# Patient Record
Sex: Female | Born: 1950 | Race: White | Hispanic: No | Marital: Married | Smoking: Never smoker
Health system: Southern US, Community
[De-identification: ages and names within clinical notes are randomized; demographics above are authoritative.]

## PROBLEM LIST (undated history)

## (undated) DIAGNOSIS — Z8719 Personal history of other diseases of the digestive system: Secondary | ICD-10-CM

## (undated) DIAGNOSIS — B0059 Other herpesviral disease of eye: Secondary | ICD-10-CM

## (undated) DIAGNOSIS — K219 Gastro-esophageal reflux disease without esophagitis: Secondary | ICD-10-CM

## (undated) DIAGNOSIS — K5792 Diverticulitis of intestine, part unspecified, without perforation or abscess without bleeding: Secondary | ICD-10-CM

## (undated) DIAGNOSIS — I10 Essential (primary) hypertension: Secondary | ICD-10-CM

## (undated) DIAGNOSIS — E669 Obesity, unspecified: Secondary | ICD-10-CM

## (undated) DIAGNOSIS — Z8619 Personal history of other infectious and parasitic diseases: Secondary | ICD-10-CM

## (undated) HISTORY — DX: Obesity, unspecified: E66.9

## (undated) HISTORY — DX: Personal history of other infectious and parasitic diseases: Z86.19

## (undated) HISTORY — DX: Other herpesviral disease of eye: B00.59

## (undated) HISTORY — DX: Essential (primary) hypertension: I10

## (undated) HISTORY — PX: ESOPHAGOGASTRODUODENOSCOPY: SHX1529

## (undated) HISTORY — PX: COLONOSCOPY: SHX174

## (undated) HISTORY — DX: Gastro-esophageal reflux disease without esophagitis: K21.9

## (undated) HISTORY — PX: TUBAL LIGATION: SHX77

## (undated) HISTORY — DX: Diverticulitis of intestine, part unspecified, without perforation or abscess without bleeding: K57.92

## (undated) HISTORY — DX: Personal history of other diseases of the digestive system: Z87.19

---

## 1998-07-10 ENCOUNTER — Ambulatory Visit (HOSPITAL_COMMUNITY): Admission: RE | Admit: 1998-07-10 | Discharge: 1998-07-10 | Payer: Self-pay | Admitting: *Deleted

## 1998-07-10 ENCOUNTER — Encounter: Payer: Self-pay | Admitting: *Deleted

## 1999-03-21 ENCOUNTER — Other Ambulatory Visit: Admission: RE | Admit: 1999-03-21 | Discharge: 1999-03-21 | Payer: Self-pay | Admitting: *Deleted

## 1999-08-07 ENCOUNTER — Ambulatory Visit (HOSPITAL_COMMUNITY): Admission: RE | Admit: 1999-08-07 | Discharge: 1999-08-07 | Payer: Self-pay | Admitting: *Deleted

## 1999-08-07 ENCOUNTER — Encounter: Payer: Self-pay | Admitting: *Deleted

## 2000-04-07 ENCOUNTER — Other Ambulatory Visit: Admission: RE | Admit: 2000-04-07 | Discharge: 2000-04-07 | Payer: Self-pay | Admitting: *Deleted

## 2000-08-10 ENCOUNTER — Ambulatory Visit (HOSPITAL_COMMUNITY): Admission: RE | Admit: 2000-08-10 | Discharge: 2000-08-10 | Payer: Self-pay | Admitting: *Deleted

## 2000-08-10 ENCOUNTER — Encounter: Payer: Self-pay | Admitting: *Deleted

## 2001-04-08 ENCOUNTER — Other Ambulatory Visit: Admission: RE | Admit: 2001-04-08 | Discharge: 2001-04-08 | Payer: Self-pay | Admitting: *Deleted

## 2001-09-01 ENCOUNTER — Ambulatory Visit (HOSPITAL_COMMUNITY): Admission: RE | Admit: 2001-09-01 | Discharge: 2001-09-01 | Payer: Self-pay | Admitting: *Deleted

## 2001-09-01 ENCOUNTER — Encounter: Payer: Self-pay | Admitting: *Deleted

## 2002-07-10 ENCOUNTER — Other Ambulatory Visit: Admission: RE | Admit: 2002-07-10 | Discharge: 2002-07-10 | Payer: Self-pay | Admitting: *Deleted

## 2002-09-07 ENCOUNTER — Encounter: Payer: Self-pay | Admitting: *Deleted

## 2002-09-07 ENCOUNTER — Ambulatory Visit (HOSPITAL_COMMUNITY): Admission: RE | Admit: 2002-09-07 | Discharge: 2002-09-07 | Payer: Self-pay | Admitting: *Deleted

## 2003-06-09 HISTORY — PX: CHOLECYSTECTOMY: SHX55

## 2003-06-09 LAB — HM COLONOSCOPY

## 2003-09-19 ENCOUNTER — Ambulatory Visit (HOSPITAL_COMMUNITY): Admission: RE | Admit: 2003-09-19 | Discharge: 2003-09-19 | Payer: Self-pay | Admitting: *Deleted

## 2003-11-01 ENCOUNTER — Other Ambulatory Visit: Admission: RE | Admit: 2003-11-01 | Discharge: 2003-11-01 | Payer: Self-pay | Admitting: *Deleted

## 2004-03-30 ENCOUNTER — Encounter (INDEPENDENT_AMBULATORY_CARE_PROVIDER_SITE_OTHER): Payer: Self-pay | Admitting: *Deleted

## 2004-03-30 ENCOUNTER — Observation Stay (HOSPITAL_COMMUNITY): Admission: EM | Admit: 2004-03-30 | Discharge: 2004-03-31 | Payer: Self-pay | Admitting: *Deleted

## 2005-05-21 ENCOUNTER — Other Ambulatory Visit: Admission: RE | Admit: 2005-05-21 | Discharge: 2005-05-21 | Payer: Self-pay | Admitting: Obstetrics and Gynecology

## 2005-11-22 IMAGING — CR DG CHEST 1V PORT
1 series · 1 of 1 positions shown · non-contrast
Comparison: None.

CLINICAL DATA: Cholelithiasis. Preoperative respiratory evaluation prior to cholecystectomy.

PORTABLE CHEST - 1 VIEW  [DATE]/8991 1781 hours:

[view not recorded]
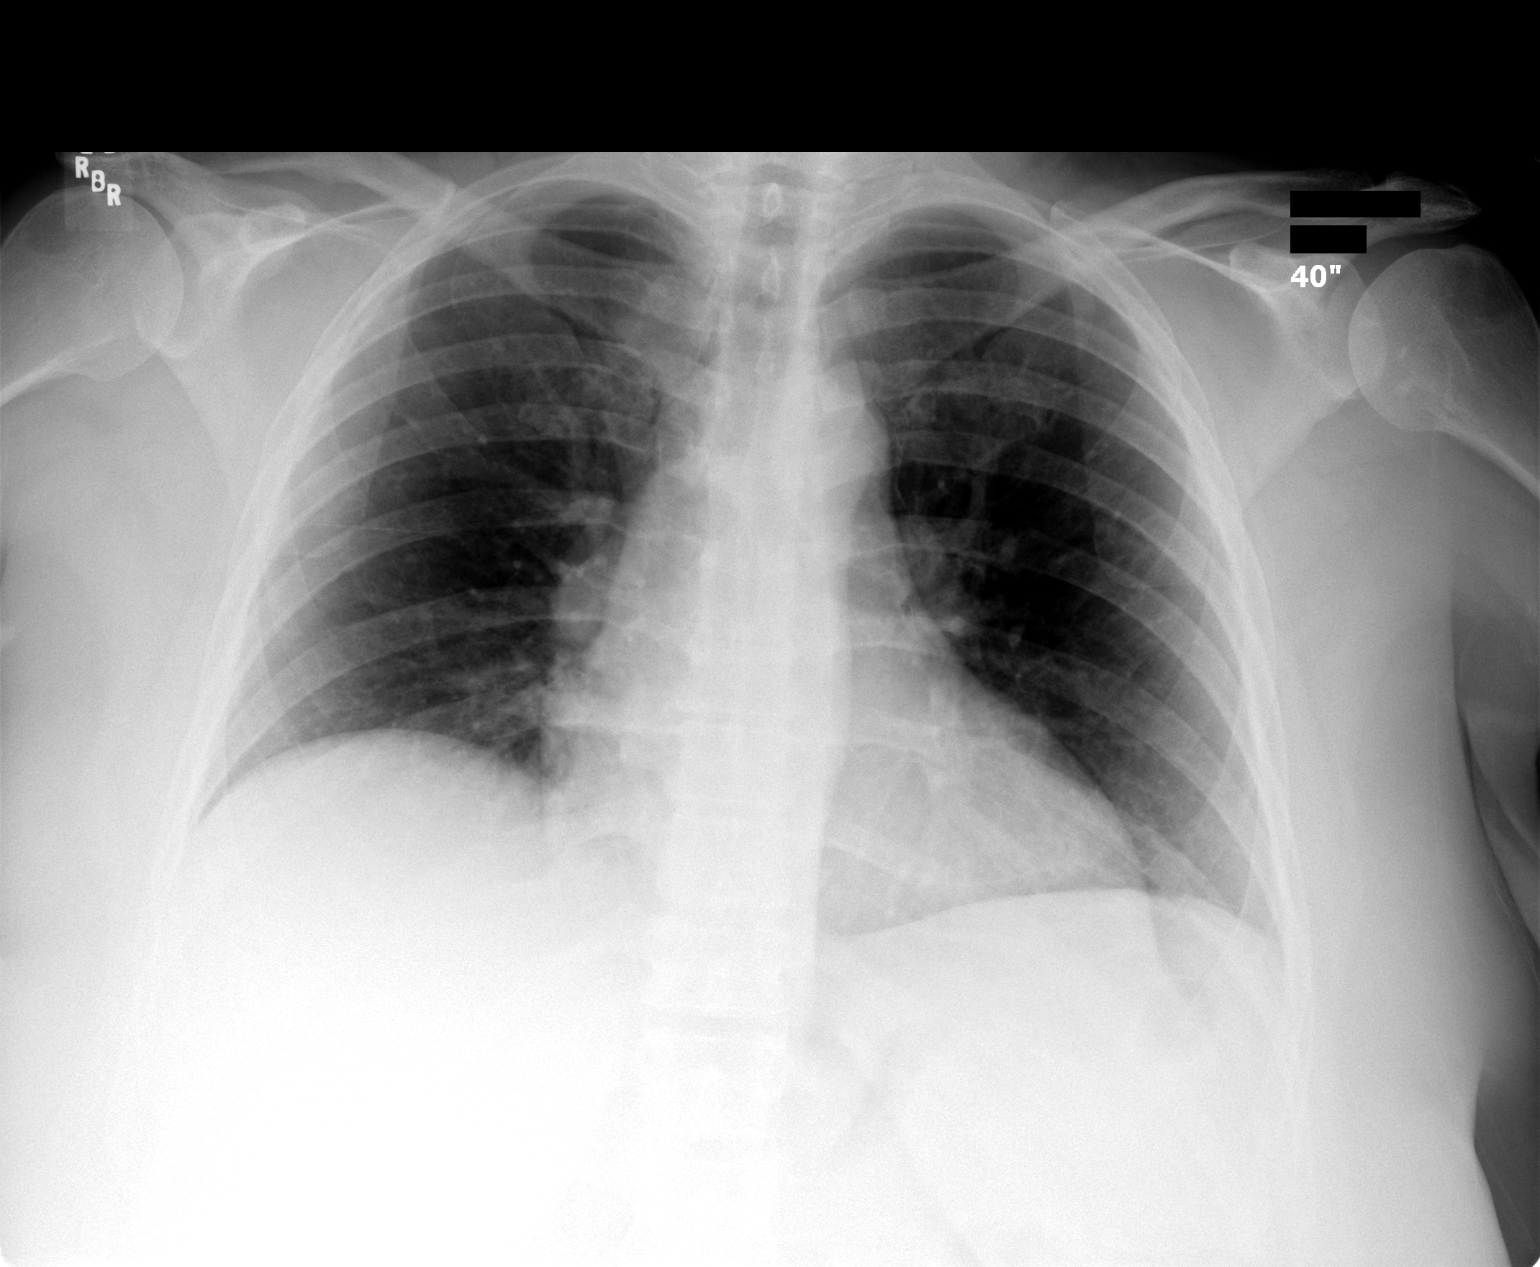

[1 of 1 positions shown; findings below may reference images not displayed]

FINDINGS: The cardiomediastinal silhouette is unremarkable for the AP technique. The lungs appear
clear. Mild elevation of the right hemidiaphragm is present without obvious intrathoracic cause.  

IMPRESSION

No evidence of acute disease.

## 2005-11-22 IMAGING — US US ABDOMEN COMPLETE
1 series · 14 of 25 positions shown · non-contrast
Comparison: None

CLINICAL DATA: Right upper quadrant abdominal pain.

COMPLETE ABDOMINAL ULTRASOUND  03/30/2004:

[Series 1: abdomen · 0.33mm/px · 14 of 70 slices shown]
[im 1/70]
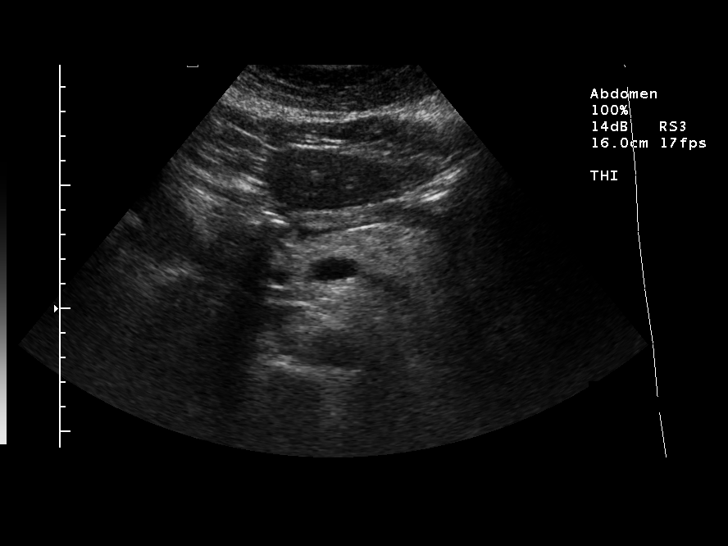
[im 6/70]
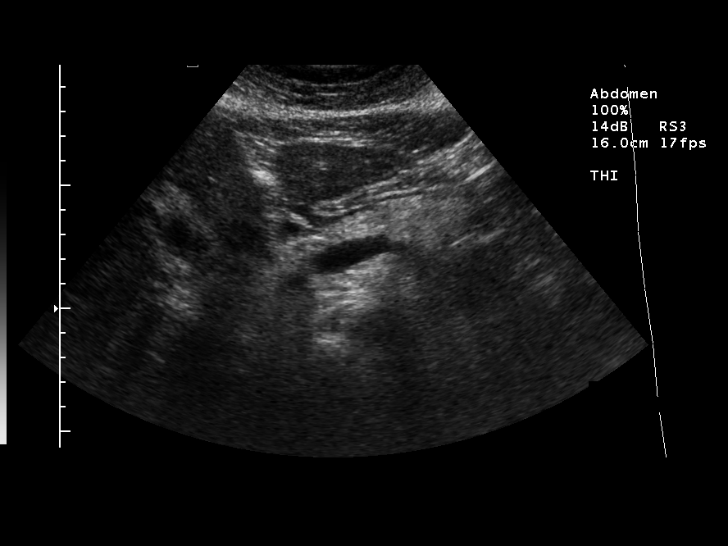
[im 12/70]
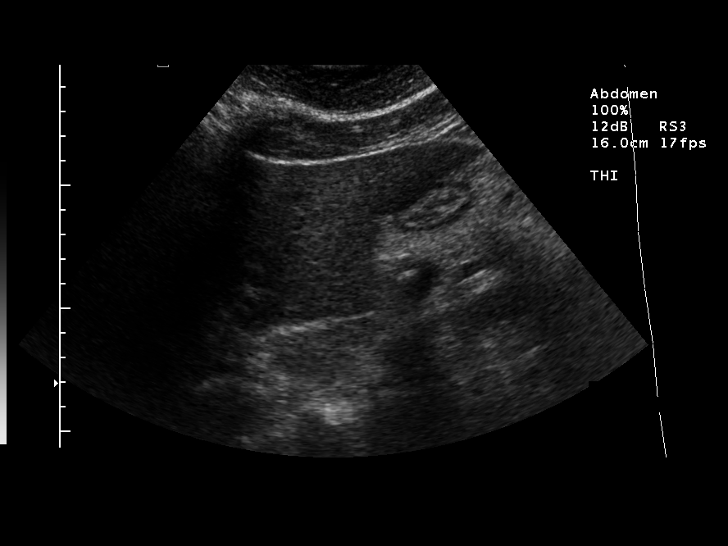
[im 18/70]
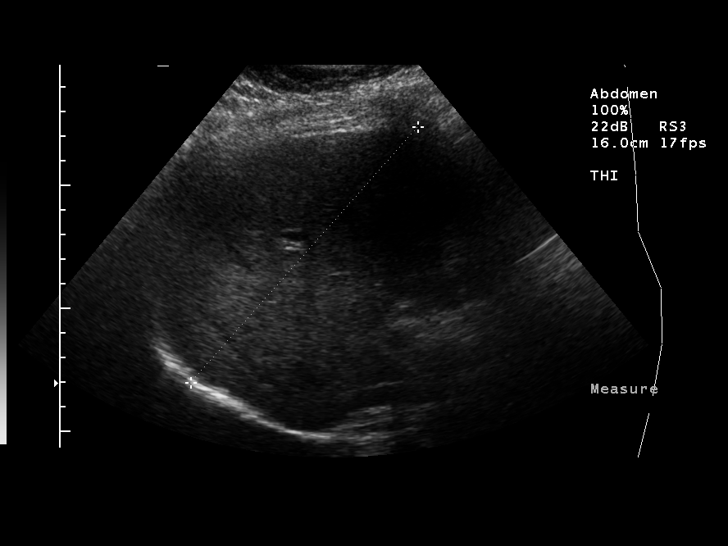
[im 24/70]
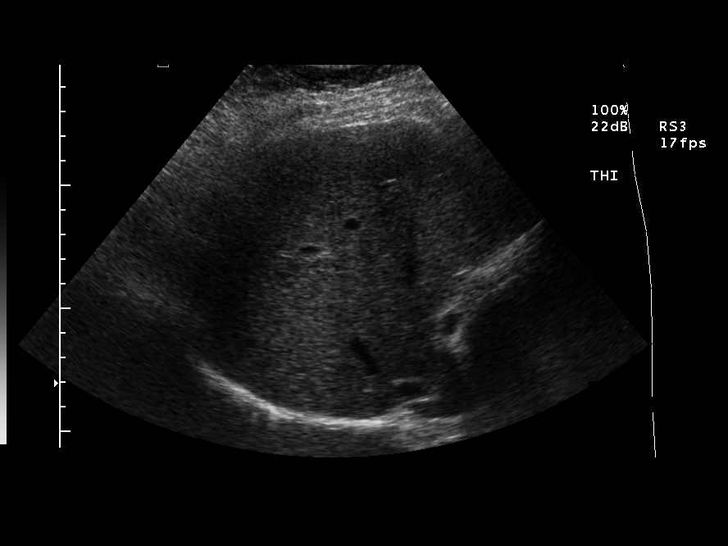
[im 26/70]
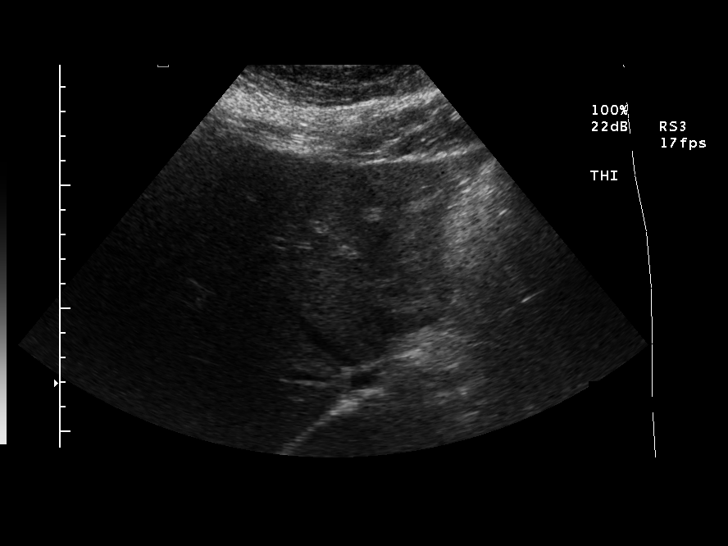
[im 32/70]
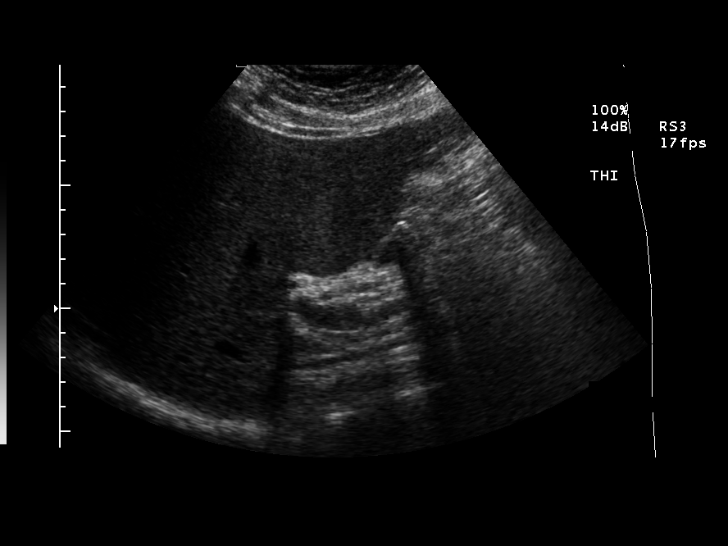
[im 38/70]
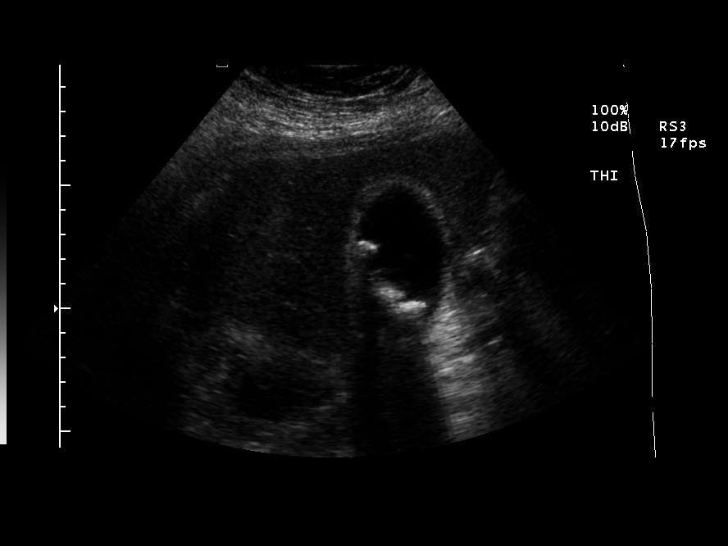
[im 44/70]
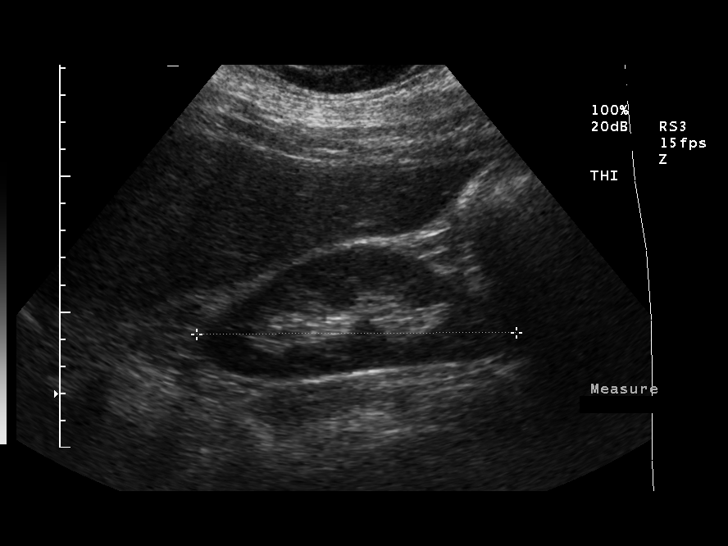
[im 47/70]
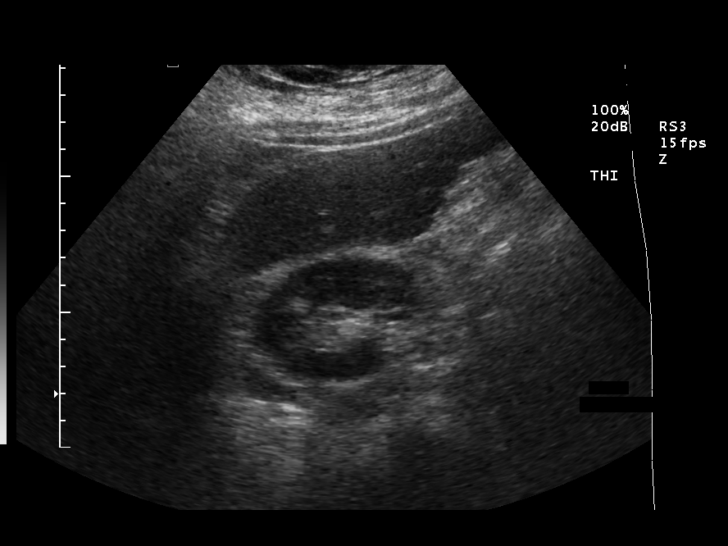
[im 52/70]
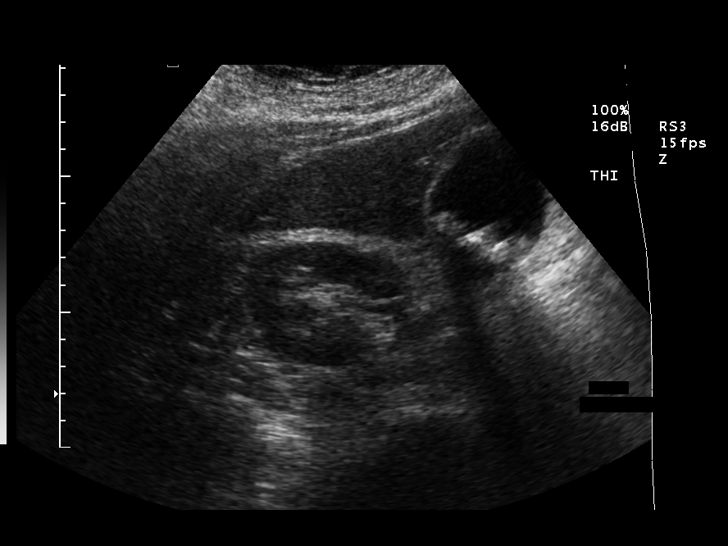
[im 58/70]
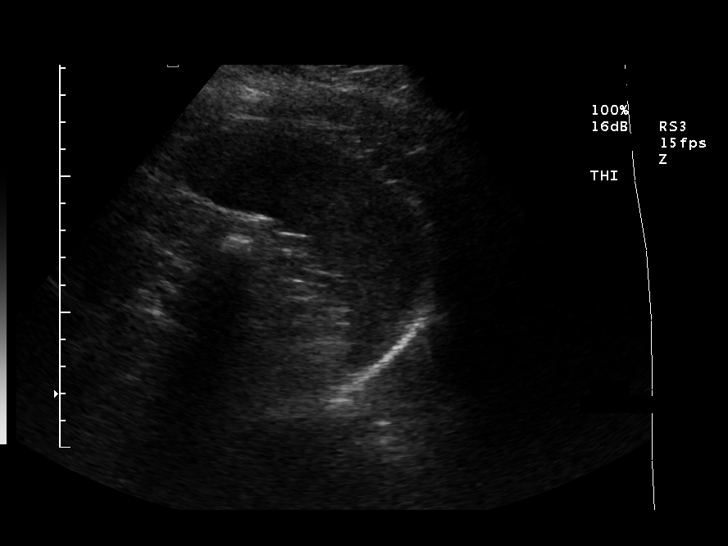
[im 64/70]
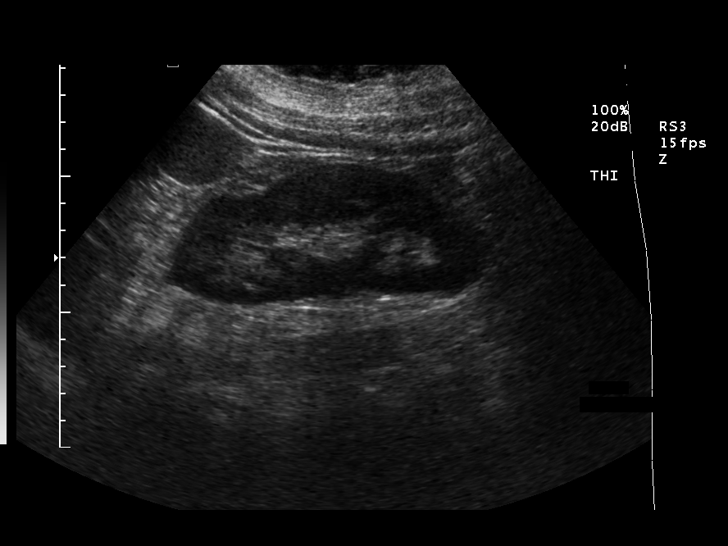
[im 70/70]
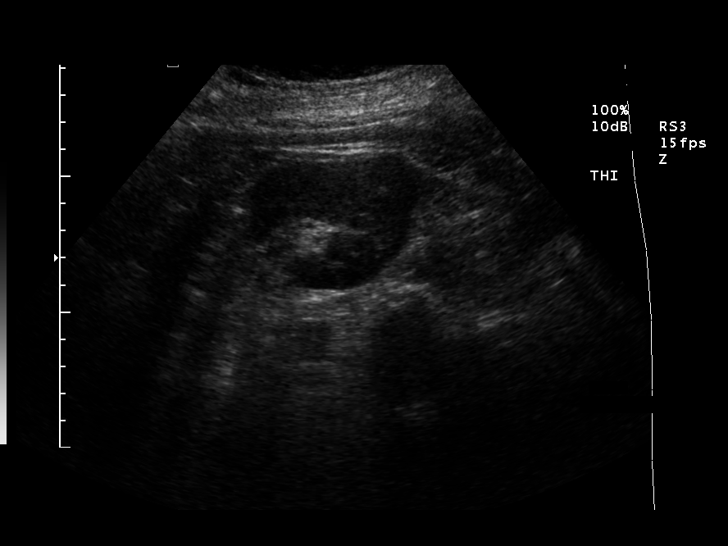

[14 of 25 positions shown; findings below may reference images not displayed]

FINDINGS: Gallbladder:  Multiple gallstones are present. There is no gallbladder wall thickening or
pericholecystic fluid. However, the technologist states that there is a positive Murphy's sign upon
examination of the gallbladder.

Common bile duct: Upper normal, 6 mm. No definite duct stones identified.

Liver:  Normal.

Inferior vena cava:  Patent.

Pancreas:  Normal.

Spleen:  Normal, 10.3 cm.

Right kidney:  Normal, 11.8 cm.

Left kidney:  Normal, 13.2 cm.

Abdominal aorta:  Normal, maximum diameter 1.5 cm.

IMPRESSION

1. Cholelithiasis and positive sonographic Murphy's sign.

2. Upper normal common bile duct measuring 6 mm in diameter. No duct stones identified.

3. Normal abdominal ultrasound otherwise.

## 2006-02-09 ENCOUNTER — Ambulatory Visit (HOSPITAL_COMMUNITY): Admission: RE | Admit: 2006-02-09 | Discharge: 2006-02-09 | Payer: Self-pay | Admitting: Obstetrics & Gynecology

## 2006-07-21 ENCOUNTER — Other Ambulatory Visit: Admission: RE | Admit: 2006-07-21 | Discharge: 2006-07-21 | Payer: Self-pay | Admitting: Obstetrics & Gynecology

## 2007-06-30 ENCOUNTER — Ambulatory Visit (HOSPITAL_COMMUNITY): Admission: RE | Admit: 2007-06-30 | Discharge: 2007-06-30 | Payer: Self-pay | Admitting: Obstetrics & Gynecology

## 2007-07-27 ENCOUNTER — Other Ambulatory Visit: Admission: RE | Admit: 2007-07-27 | Discharge: 2007-07-27 | Payer: Self-pay | Admitting: Obstetrics & Gynecology

## 2008-07-06 ENCOUNTER — Ambulatory Visit (HOSPITAL_COMMUNITY): Admission: RE | Admit: 2008-07-06 | Discharge: 2008-07-06 | Payer: Self-pay | Admitting: Obstetrics & Gynecology

## 2008-08-01 ENCOUNTER — Other Ambulatory Visit: Admission: RE | Admit: 2008-08-01 | Discharge: 2008-08-01 | Payer: Self-pay | Admitting: Obstetrics & Gynecology

## 2009-08-01 ENCOUNTER — Ambulatory Visit (HOSPITAL_COMMUNITY): Admission: RE | Admit: 2009-08-01 | Discharge: 2009-08-01 | Payer: Self-pay | Admitting: Obstetrics & Gynecology

## 2010-08-12 ENCOUNTER — Other Ambulatory Visit: Payer: Self-pay | Admitting: Obstetrics & Gynecology

## 2010-08-12 DIAGNOSIS — Z1231 Encounter for screening mammogram for malignant neoplasm of breast: Secondary | ICD-10-CM

## 2010-08-20 ENCOUNTER — Ambulatory Visit (HOSPITAL_COMMUNITY)
Admission: RE | Admit: 2010-08-20 | Discharge: 2010-08-20 | Disposition: A | Discharge status: Home / Self Care | Payer: BC Managed Care – PPO | Source: Ambulatory Visit | Attending: Obstetrics & Gynecology | Admitting: Obstetrics & Gynecology

## 2010-08-20 DIAGNOSIS — Z1231 Encounter for screening mammogram for malignant neoplasm of breast: Secondary | ICD-10-CM

## 2010-10-24 NOTE — Op Note (Signed)
NAMEMIHO, MONDA                 ACCOUNT NO.:  [id number]   MEDICAL RECORD NO.:  [id number]          PATIENT TYPE:  INP   LOCATION:  5741                         FACILITY:  [facility]   PHYSICIAN:  Gabrielle Dare. Janee Morn, M.D.DATE OF BIRTH:  [birth year]   DATE OF PROCEDURE:  03/30/2004  DATE OF DISCHARGE:                                 OPERATIVE REPORT   PREOPERATIVE DIAGNOSIS:  Acute cholecystitis.   POSTOPERATIVE DIAGNOSIS:  Acute cholecystitis.   PROCEDURE:  Laparoscopic cholecystectomy and intraoperative cholangiogram.   SURGEON:  Gabrielle Dare. Janee Morn, M.D.   ASSISTANT:  Anselm Pancoast. Zachery Dakins, M.D.   ANESTHESIA:  General.   HISTORY OF PRESENT ILLNESS:  The patient is a 60 year old white female with  a history of episodic right upper quadrant abdominal pain after eating, who  developed severe right upper quadrant pain last night.  It persisted.  She  came to the [facility] Emergency Department.  Workup was consistent with  acute cholecystitis, and she was brought to the operating room for a  cholecystectomy.   PROCEDURE IN DETAIL:  The patient received intravenous antibiotics.  She was  brought to the operating room after informed consent was obtained.  General  anesthesia was administered.  Her abdomen was prepped and draped in a  sterile fashion, and an infraumbilical incision was made.  Subcutaneous  tissues were dissected down, revealing the anterior fascia, which was  divided sharply.  The peritoneal cavity was entered under direct vision.  A  0 Vicryl pursestring suture was placed around the fascial opening.  The  Hasson trocar was inserted into the abdomen, and the abdomen was insufflated  with carbon dioxide in standard fashion.  Under direct vision, an 11 mm  epigastric and two 5 mm lateral ports were placed.  The gallbladder was very  distended.  It was drained initially with a __________  drainage system.  Once this was accomplished, the dome was retracted superomedially,  and the  infundibulum was retracted inferolaterally.  Dissection began laterally and  progressed medially.  This first identified an anterior artery.  This was  clipped twice proximally and once distally and divided.  Further dissection  easily revealed the cystic duct.  There was a good deal of inflammation, but  a good window was made between the cystic duct, the infundibulum, and the  liver.  Once adequate visualization was achieved, a clip was placed on the  infundibular-cystic duct junction.  A small nick was made in the cystic  duct, and a Reddick cholangiogram catheter was inserted.  Intraoperative  cholangiogram was obtained, which demonstrated no common bile duct filling  defects.  There was a generous-sized common bile duct and a small stump of  the cystic duct.  Once this was accomplished, the Reddick cholangiogram  catheter was removed.  Three clips were placed proximally on the cystic duct  and one distally and was divided.  Further dissection revealed another  branch of the cystic artery, which was clipped twice proximally and once  distally and divided.  The gallbladder was then taken off the liver bed.  We  did encounter another posterior branch of the cystic artery, and this was  clipped twice proximally.  The gallbladder was removed from the liver bed  with Bovie cautery.  Several areas were cauterized to get excellent  hemostasis.  The gallbladder was placed in an EndoCatch bag and taken out of  the abdomen via the umbilical cord site.  There were several gallstones.  At  this time, the liver bed was copiously irrigated, and cautery was used to  get excellent hemostasis.  After this was accomplished, the irrigant  returned clear.  Clips remained in good position.  The irrigant fluid was  evacuated.  The ports were removed under direct vision after rechecking the  gallbladder bed, and it was hemostatic.  The pneumoperitoneum was released,  and the Hasson trocar was  removed.  The umbilical fascia was closed by tying  the pursestring 0 Vicryl suture.  All four wounds were copiously irrigated.  Sponge, needle, and instrument counts were correct.  The skin of each wound  was closed with subcuticular 4-0 Vicryl sutures.  Benzoin, Steri-Strips, and  sterile dressings were applied.  The patient tolerated the procedure well,  without apparent complications, and was taken to the recovery room in stable  condition.       BET/MEDQ  D:  03/30/2004  T:  03/30/2004  Job:  [id number]

## 2010-10-24 NOTE — H&P (Signed)
[name], [name]                 ACCOUNT NO.:  [id number]   MEDICAL RECORD NO.:  [id number]          PATIENT TYPE:  INP   LOCATION:  1826                         FACILITY:  [facility]   PHYSICIAN:  Gabrielle Dare. Janee Morn, M.D.DATE OF BIRTH:  1950/12/29   DATE OF ADMISSION:  03/30/2004  DATE OF DISCHARGE:                                HISTORY & PHYSICAL   CHIEF COMPLAINT:  Right upper quadrant abdominal pain.   HISTORY OF PRESENT ILLNESS:  The patient is a 60 year old white female with  a history of episodic right upper quadrant abdominal pain episodes which  happened after eating.  She had another episode last night. The pain was  more intense this time and persisted through until this morning and she came  to the emergency room at [facility] for evaluation.  Workup here showed white blood cell count of 16.2, LFTs within normal  limits.  Abdominal ultrasound was then done which showed cholelithiasis and  a sonographic Murphy's sign.  The patient continues to have some moderate  right upper quadrant tenderness.  She denies nausea and vomiting.   PAST MEDICAL HISTORY:  Hypertension.   PAST SURGICAL HISTORY:  Cesarean section x 2.   CURRENT MEDICATIONS:  Lotensin 10 mg p.o. q.d.   SOCIAL HISTORY:  She does not smoke.  She drinks alcohol about one drink per  month.   REVIEW OF SYMPTOMS:  GENERAL:  Negative.  CARDIAC:  Negative.  PULMONARY:  Negative.  GI:  See the history of present illness.  GU:  Negative.  MUSCULOSKELETAL:  Negative.   PHYSICAL EXAMINATION:  VITAL SIGNS:  Temperature is 99.3, blood pressure  125/69, heart rate 72, respirations 20.  She is awake, alert, and in no  acute distress.  HEENT:  Pupils are equal and reactive.  Sclerae is nonicteric.  NECK:  Supple with no masses.  LUNGS:  Clear to auscultation bilaterally with normal respiratory excursion.  HEART:  Regular rate and rhythm.  PMI is palpable on the left chest.  ABDOMEN:  Soft.  Mildly obese.   No masses are palpated.  She does have  tenderness on the right upper quadrant but no guarding.  No organomegaly is  noted.  SKIN:  Warm and dry with no rashes.  EXTREMITIES:  No significant edema.   LABORATORY DATA:  White blood cell count 16.2, hemoglobin 12.4, platelets  390,000.  Basic metabolic panel is unremarkable.  Liver function tests are  normal.  Lipase is 22.  Urinalysis is negative.   Abdominal ultrasound results are as above.   IMPRESSION:  Acute cholecystitis.   PLAN:  Check an EKG and chest x-ray.  Give her some IV antibiotics.  Take  her to the operating room for a laparoscopic cholecystectomy with  intraoperative cholangiogram.  The procedure, risks, and benefits including  conversion to open procedure were discussed in detail with the patient and  she was agreeable.  Other questions were answered.       BET/MEDQ  D:  03/30/2004  T:  03/30/2004  Job:  [id number]

## 2010-10-24 NOTE — Discharge Summary (Signed)
[name], [name]                 ACCOUNT NO.:  [id number]   MEDICAL RECORD NO.:  [id number]          PATIENT TYPE:  INP   LOCATION:  5741                         FACILITY:  [facility]   PHYSICIAN:  Gabrielle Dare. Janee Morn, M.D.DATE OF BIRTH:  [birth year]   DATE OF ADMISSION:  03/30/2004  DATE OF DISCHARGE:  03/31/2004                                 DISCHARGE SUMMARY   DISCHARGE DIAGNOSES:  1.  Acute cholecystitis.  2.  Status post laparoscopic cholecystectomy with intraoperative      cholangiogram.   HISTORY OF PRESENT ILLNESS:  Patient is a 60 year old white female who has a  history of episodic right upper quadrant abdominal pain who after eating the  night before last developed very severe right upper quadrant pain that  persisted.  She came to the [facility] Emergency Department where work-up  was consistent with acute cholecystitis including white blood cell count  16.2 and ultrasound findings.  She was admitted and taken to the operating  room for cholecystectomy.   HOSPITAL COURSE:  Patient underwent an uncomplicated laparoscopic  cholecystectomy with intraoperative cholangiogram.  Postoperatively she  remained afebrile and hemodynamically stable, tolerating gradual advancement  of her diet and is discharged home on postoperative day one in stable  condition.   DIET:  Low fat.   ACTIVITY:  No lifting.   DISCHARGE MEDICATIONS:  She is to continue her previous medications and take  Percocet 5/325 one to two p.o. q.6h. p.r.n. pain.   FOLLOWUP:  With myself in three weeks.       BET/MEDQ  D:  03/31/2004  T:  03/31/2004  Job:  [id number]

## 2012-01-25 ENCOUNTER — Other Ambulatory Visit: Payer: Self-pay | Admitting: Obstetrics & Gynecology

## 2012-01-25 DIAGNOSIS — Z1231 Encounter for screening mammogram for malignant neoplasm of breast: Secondary | ICD-10-CM

## 2012-02-25 ENCOUNTER — Ambulatory Visit (HOSPITAL_COMMUNITY)
Admission: RE | Admit: 2012-02-25 | Discharge: 2012-02-25 | Disposition: A | Discharge status: Home / Self Care | Payer: BC Managed Care – PPO | Source: Ambulatory Visit | Attending: Obstetrics & Gynecology | Admitting: Obstetrics & Gynecology

## 2012-02-25 DIAGNOSIS — Z1231 Encounter for screening mammogram for malignant neoplasm of breast: Secondary | ICD-10-CM | POA: Insufficient documentation

## 2012-05-08 LAB — HM PAP SMEAR: HM Pap smear: NORMAL

## 2012-07-09 LAB — HM MAMMOGRAPHY

## 2013-01-06 ENCOUNTER — Encounter: Payer: Self-pay | Admitting: Internal Medicine

## 2013-01-06 ENCOUNTER — Ambulatory Visit (INDEPENDENT_AMBULATORY_CARE_PROVIDER_SITE_OTHER): Payer: BC Managed Care – PPO | Admitting: Internal Medicine

## 2013-01-06 VITALS — BP 124/84 | HR 74 | Temp 98.4°F | Wt 224.0 lb

## 2013-01-06 DIAGNOSIS — Z8489 Family history of other specified conditions: Secondary | ICD-10-CM

## 2013-01-06 DIAGNOSIS — Z9189 Other specified personal risk factors, not elsewhere classified: Secondary | ICD-10-CM

## 2013-01-06 DIAGNOSIS — Z820 Family history of epilepsy and other diseases of the nervous system: Secondary | ICD-10-CM | POA: Insufficient documentation

## 2013-01-06 DIAGNOSIS — K219 Gastro-esophageal reflux disease without esophagitis: Secondary | ICD-10-CM

## 2013-01-06 DIAGNOSIS — I10 Essential (primary) hypertension: Secondary | ICD-10-CM

## 2013-01-06 DIAGNOSIS — Z87898 Personal history of other specified conditions: Secondary | ICD-10-CM | POA: Insufficient documentation

## 2013-01-06 NOTE — Patient Instructions (Signed)
Continue lifestyle intervention healthy eating and exercise . For now no asa until evaluated by Dr Loreta Ave and can discuss at your check up in March.  Losing weight even a little bit can help all your risk factors and gerd sx.  Get enough sleep  .     Calendar    2 weeks    Of sleep  Eating times  Beverages and activity .   To implement change .  Preventive   Care  In  3  /15

## 2013-01-06 NOTE — Progress Notes (Signed)
Chief Complaint  Patient presents with  . Establish Care  . Hypertension    HPI: Patient comes in as new patient visit . She is originally from the [place] area. Previous care was Dr. Maurice Small   OB/GYN is Dr. Leda Quail. Patient's sister is in our practice and has a history of significant sleep apnea secondary atrial fib and stroke. She's had hypertension for about 20 years well controlled on Lotensin. Has some questions about risk and intervention.  Last laboratory tests were in March of this year presents them here in the her normal. Cardiovascular pulmonary symptoms. She has some snoring at night but no obstructive symptoms. No palpitations racing heart syncope.  GERD : Gets reflux symptoms about once a week.  Taking Tums.   Doesn't like to take meds no dyshphagi but Dr. Loreta Ave wants to do an endoscopy to rule out significant problems. She was put on DEXA lawns at some point but stopped it.  Does lifestyle interventions. Limits  alcohol and caffeine.  Husband works part Medical illustrator of home. Medical problems  but is independent HH of 2  Pet dog.  ROS: See pertinent positives and negatives per HPI. Some notation of a vitamin D deficiency at some time and history of an elevated blood sugar that has resolved. Otherwise 12 system review is negative except as per history of present illness.  Past Medical History  Diagnosis Date  . Chicken pox   . GERD (gastroesophageal reflux disease)   . Diverticulitis   . Hypertension     Family History  Problem Relation Age of Onset  . Hyperlipidemia Mother   . Hypertension Mother   . Hypertension Father   . Hypertension Sister   . Stroke Sister   . Arthritis Sister   . COPD Mother     Deceased  . Cancer - Other Father     Liver deceased  . Obstructive Sleep Apnea Sister     And secondary atrial fibrillation.    History   Social History  . Marital Status: Married    Spouse Name: N/A    Number of Children: N/A  . Years of  Education: N/A   Social History Main Topics  . Smoking status: Never Smoker   . Smokeless tobacco: None  . Alcohol Use: No  . Drug Use: None  . Sexually Active: None   Other Topics Concern  . 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 benazepril (LOTENSIN) 20 MG tablet Take 20 mg by mouth daily.       No facility-administered encounter medications on file as of 01/06/2013.    EXAM:  BP 124/84  Pulse 74  Temp(Src) 98.4 F (36.9 C) (Oral)  Wt 224 lb (101.606 kg)  SpO2 98%  LMP 06/08/2004  There is no height on file to calculate BMI.  GENERAL: vitals reviewed and listed above, alert, oriented, appears well hydrated and in no acute distress HEENT: atraumatic, conjunctiva  clear, no obvious abnormalities on inspection of external nose and ears TMs clear OP : no lesion edema or exudate airway open NECK: no obvious masses on inspection palpation  no adenopathy or bruits LUNGS: clear to auscultation bilaterally, no wheezes, rales or rhonchi, good air movement CV: HRRR, no gallops or murmurs no clubbing cyanosis or  peripheral edema nl cap refill pulses intact. Abdomen soft without organomegaly guarding or rebound. MS: moves all extremities without noticeable focal  abnormality PSYCH: pleasant and cooperative, no obvious depression or anxiety Reviewed laboratory from last March from Dr. Valentina Lucks creatinine blood sugar and lipids are all normal as well as BMP. TSH is also normal Cholesterol 177 range reset name 0.7 potassium 4.6 blood sugar 93  ASSESSMENT AND PLAN:  Discussed the following assessment and plan:  Unspecified essential hypertension  GERD (gastroesophageal reflux  disease)  History of snoring  Family history of sleep apnea - Sister also had CVA and atrial fibrillation Controlled followup with Dr. Loreta Ave. Plan for preventive visit in the spring when she is due can do a baseline EKG at this time and revisit the aspirin prophylaxis question. Counseled today strategies for healthy eating and exercise with a busy lifestyle. History of snoring but doesn't sound like sleep apnea although there is a family history. -Patient advised to return or notify health care team  if symptoms worsen or persist or new concerns arise.  Patient Instructions  Continue lifestyle intervention healthy eating and exercise . For now no asa until evaluated by Dr Loreta Ave and can discuss at your check up in March.  Losing weight even a little bit can help all your risk factors and gerd sx.  Get enough sleep  .     Calendar    2 weeks    Of sleep  Eating times  Beverages and activity .   To implement change .  Preventive   Care  In  3  /15       Neta Mends. Panosh M.D.

## 2013-02-02 ENCOUNTER — Ambulatory Visit: Payer: Self-pay | Admitting: Obstetrics & Gynecology

## 2013-03-02 ENCOUNTER — Encounter: Payer: Self-pay | Admitting: Obstetrics & Gynecology

## 2013-03-03 ENCOUNTER — Ambulatory Visit (INDEPENDENT_AMBULATORY_CARE_PROVIDER_SITE_OTHER): Payer: BC Managed Care – PPO | Admitting: Obstetrics & Gynecology

## 2013-03-03 ENCOUNTER — Encounter: Payer: Self-pay | Admitting: Obstetrics & Gynecology

## 2013-03-03 VITALS — BP 148/78 | HR 68 | Resp 20 | Ht 63.0 in | Wt 225.8 lb

## 2013-03-03 DIAGNOSIS — Z01419 Encounter for gynecological examination (general) (routine) without abnormal findings: Secondary | ICD-10-CM

## 2013-03-03 DIAGNOSIS — Z124 Encounter for screening for malignant neoplasm of cervix: Secondary | ICD-10-CM

## 2013-03-03 MED ORDER — CLOBETASOL PROPIONATE 0.05 % EX OINT: TOPICAL_OINTMENT | CUTANEOUS | Status: DC | PRN

## 2013-03-03 NOTE — Patient Instructions (Signed)

## 2013-03-03 NOTE — Progress Notes (Signed)
62 y.o. [id number] MarriedCaucasianF here for annual exam.  Doing well.  No vaginal bleeding.  Seeing Dr. Fabian Sharp as new PCP.  Having endoscopy with Dr. Loreta Ave due to GERD.  Work is going well.  Thinking about retirement.     Patient's last menstrual period was 06/08/2004.          Sexually active: no  The current method of family planning is tubal ligation.    Exercising: yes  walking Smoker:  no  Health Maintenance: Pap:  12/03/10 ASCUS/negative HR HPV History of abnormal Pap:  no MMG:  02/25/12 normal Colonoscopy:  2006 repeat in 10 years (Dr. Loreta Ave) BMD:   1/10 normal, plan next year TDaP:  1/09 Screening Labs: PCP, Hb today: PCP, Urine today: PCP   reports that she has never smoked. She has never used smokeless tobacco. She reports that  drinks alcohol. She reports that she does not use illicit drugs.  Past Medical History  Diagnosis Date  . Chicken pox   . GERD (gastroesophageal reflux disease)   . Diverticulitis   . Hypertension     Past Surgical History  Procedure Laterality Date  . Cesarean section      2  . Cholecystectomy  2005  . Tubal ligation      BTL    Current Outpatient Prescriptions  Medication Sig Dispense Refill  . benazepril (LOTENSIN) 20 MG tablet Take 20 mg by mouth daily.      . clobetasol ointment (TEMOVATE) 0.05 % Apply topically as needed.       No current facility-administered medications for this visit.    Family History  Problem Relation Age of Onset  . Hyperlipidemia Mother   . Hypertension Mother   . Hypertension Father   . Hypertension Sister   . Stroke Sister   . Arthritis Sister   . COPD Mother     Deceased  . Cancer - Other Father     Liver deceased  . Obstructive Sleep Apnea Sister     And secondary atrial fibrillation.    ROS:  Pertinent items are noted in HPI.  Otherwise, a comprehensive ROS was negative.  Exam:   BP 148/78  Pulse 68  Resp 20  Ht 5\' 3"  (1.6 m)  Wt 225 lb 12.8 oz (102.422 kg)  BMI 40.01 kg/m2  LMP  06/08/2004  Weight change: +5lbs  Height: 5\' 3"  (160 cm)  Ht Readings from Last 3 Encounters:  03/03/13 5\' 3"  (1.6 m)    General appearance: alert, cooperative and appears stated age Head: Normocephalic, without obvious abnormality, atraumatic Neck: no adenopathy, supple, symmetrical, trachea midline and thyroid normal to inspection and palpation Lungs: clear to auscultation bilaterally Breasts: normal appearance, no masses or tenderness Heart: regular rate and rhythm Abdomen: soft, non-tender; bowel sounds normal; no masses,  no organomegaly Extremities: extremities normal, atraumatic, no cyanosis or edema Skin: Skin color, texture, turgor normal. No rashes or lesions Lymph nodes: Cervical, supraclavicular, and axillary nodes normal. No abnormal inguinal nodes palpated Neurologic: Grossly normal   Pelvic: External genitalia:  no lesions              Urethra:  normal appearing urethra with no masses, tenderness or lesions              Bartholins and Skenes: normal                 Vagina: normal appearing vagina with normal color and discharge, no lesions  Cervix: no lesions              Pap taken: yes Bimanual Exam:  Uterus:  normal size, contour, position, consistency, mobility, non-tender              Adnexa: normal adnexa and no mass, fullness, tenderness               Rectovaginal: Confirms               Anus:  normal sphincter tone, no lesions  A:  Well Woman with normal exam PMP, no HRT H/O LS&A hypertension  P:   Mammogram yearly pap smear only today.  ASCUS with neg HR HPV 2012. Labs with Dr. Fabian Sharp Rx for clobetasol to pharmacy return annually or prn  An After Visit Summary was printed and given to the patient.

## 2013-03-07 LAB — IPS PAP SMEAR ONLY

## 2013-04-05 ENCOUNTER — Other Ambulatory Visit: Payer: Self-pay | Admitting: Obstetrics & Gynecology

## 2013-04-05 DIAGNOSIS — Z1231 Encounter for screening mammogram for malignant neoplasm of breast: Secondary | ICD-10-CM

## 2013-04-28 ENCOUNTER — Ambulatory Visit (HOSPITAL_COMMUNITY): Payer: BC Managed Care – PPO

## 2013-05-02 ENCOUNTER — Ambulatory Visit (HOSPITAL_COMMUNITY)
Admission: RE | Admit: 2013-05-02 | Discharge: 2013-05-02 | Disposition: A | Discharge status: Home / Self Care | Payer: BC Managed Care – PPO | Source: Ambulatory Visit | Attending: Obstetrics & Gynecology | Admitting: Obstetrics & Gynecology

## 2013-05-02 DIAGNOSIS — Z1231 Encounter for screening mammogram for malignant neoplasm of breast: Secondary | ICD-10-CM | POA: Insufficient documentation

## 2013-05-12 ENCOUNTER — Telehealth: Payer: Self-pay | Admitting: Internal Medicine

## 2013-05-12 ENCOUNTER — Other Ambulatory Visit: Payer: Self-pay | Admitting: Family Medicine

## 2013-05-12 MED ORDER — BENAZEPRIL HCL 20 MG PO TABS: 20.0000 mg | ORAL_TABLET | Freq: Every day | ORAL | Status: DC

## 2013-05-12 NOTE — Telephone Encounter (Signed)
Sent to the pharmacy by e-scribe. 

## 2013-05-12 NOTE — Telephone Encounter (Signed)
Pt request refillbenazepril (LOTENSIN) 20 MG tablet  [facility]

## 2013-08-22 ENCOUNTER — Other Ambulatory Visit (INDEPENDENT_AMBULATORY_CARE_PROVIDER_SITE_OTHER): Payer: BC Managed Care – PPO

## 2013-08-22 DIAGNOSIS — Z0000 Encounter for general adult medical examination without abnormal findings: Secondary | ICD-10-CM

## 2013-08-22 LAB — CBC WITH DIFFERENTIAL/PLATELET
BASOS ABS: 0 10*3/uL (ref 0.0–0.1)
Basophils Relative: 0.5 % (ref 0.0–3.0)
EOS ABS: 0.3 10*3/uL (ref 0.0–0.7)
EOS PCT: 4.4 % (ref 0.0–5.0)
HEMATOCRIT: 36.2 % (ref 36.0–46.0)
HEMOGLOBIN: 12 g/dL (ref 12.0–15.0)
LYMPHS PCT: 25.8 % (ref 12.0–46.0)
Lymphs Abs: 1.8 10*3/uL (ref 0.7–4.0)
MCHC: 33.2 g/dL (ref 30.0–36.0)
MCV: 86.8 fl (ref 78.0–100.0)
Monocytes Absolute: 0.4 10*3/uL (ref 0.1–1.0)
Monocytes Relative: 6.3 % (ref 3.0–12.0)
NEUTROS PCT: 63 % (ref 43.0–77.0)
Neutro Abs: 4.4 10*3/uL (ref 1.4–7.7)
Platelets: 283 10*3/uL (ref 150.0–400.0)
RBC: 4.17 Mil/uL (ref 3.87–5.11)
RDW: 13.1 % (ref 11.5–14.6)
WBC: 6.9 10*3/uL (ref 4.5–10.5)

## 2013-08-22 LAB — TSH: TSH: 2.46 u[IU]/mL (ref 0.35–5.50)

## 2013-08-22 LAB — HEPATIC FUNCTION PANEL
ALBUMIN: 3.8 g/dL (ref 3.5–5.2)
ALK PHOS: 73 U/L (ref 39–117)
ALT: 18 U/L (ref 0–35)
AST: 18 U/L (ref 0–37)
BILIRUBIN TOTAL: 0.5 mg/dL (ref 0.3–1.2)
Bilirubin, Direct: 0 mg/dL (ref 0.0–0.3)
Total Protein: 7 g/dL (ref 6.0–8.3)

## 2013-08-22 LAB — LIPID PANEL
CHOL/HDL RATIO: 3
CHOLESTEROL: 194 mg/dL (ref 0–200)
HDL: 61.4 mg/dL (ref >39.00)
LDL CALC: 122 mg/dL — AB (ref 0–99)
TRIGLYCERIDES: 52 mg/dL (ref 0.0–149.0)
VLDL: 10.4 mg/dL (ref 0.0–40.0)

## 2013-08-22 LAB — BASIC METABOLIC PANEL
BUN: 23 mg/dL (ref 6–23)
CALCIUM: 8.9 mg/dL (ref 8.4–10.5)
CHLORIDE: 107 meq/L (ref 96–112)
CO2: 30 mEq/L (ref 19–32)
CREATININE: 0.9 mg/dL (ref 0.4–1.2)
GFR: 72.08 mL/min (ref >60.00)
GLUCOSE: 115 mg/dL — AB (ref 70–99)
Potassium: 5.1 mEq/L (ref 3.5–5.1)
Sodium: 142 mEq/L (ref 135–145)

## 2013-08-29 ENCOUNTER — Ambulatory Visit (INDEPENDENT_AMBULATORY_CARE_PROVIDER_SITE_OTHER): Payer: BC Managed Care – PPO | Admitting: Internal Medicine

## 2013-08-29 ENCOUNTER — Encounter: Payer: Self-pay | Admitting: Internal Medicine

## 2013-08-29 VITALS — BP 144/80 | HR 66 | Temp 98.4°F | Ht 62.75 in | Wt 228.0 lb

## 2013-08-29 DIAGNOSIS — I10 Essential (primary) hypertension: Secondary | ICD-10-CM

## 2013-08-29 DIAGNOSIS — Z0000 Encounter for general adult medical examination without abnormal findings: Secondary | ICD-10-CM

## 2013-08-29 DIAGNOSIS — R7309 Other abnormal glucose: Secondary | ICD-10-CM

## 2013-08-29 DIAGNOSIS — R739 Hyperglycemia, unspecified: Secondary | ICD-10-CM | POA: Insufficient documentation

## 2013-08-29 DIAGNOSIS — K219 Gastro-esophageal reflux disease without esophagitis: Secondary | ICD-10-CM

## 2013-08-29 DIAGNOSIS — Z6837 Body mass index (BMI) 37.0-37.9, adult: Secondary | ICD-10-CM | POA: Insufficient documentation

## 2013-08-29 DIAGNOSIS — I8390 Asymptomatic varicose veins of unspecified lower extremity: Secondary | ICD-10-CM | POA: Insufficient documentation

## 2013-08-29 MED ORDER — BENAZEPRIL-HYDROCHLOROTHIAZIDE 20-12.5 MG PO TABS: 1.0000 | ORAL_TABLET | Freq: Every day | ORAL | Status: DC

## 2013-08-29 NOTE — Patient Instructions (Addendum)
lifestyle intervention healthy eating and exercise . 150 minutes of exercise weeks  ,  Lose weight  To healthy levels. Avoid trans fats and processed foods;  Increase fresh fruits and veges to 5 servings per day. And avoid  All sweet beverages  Including tea and juice. Your blood sugar is in the prediabetic range and you are at risk to develop diabetes .  Let us know how we can help  Avoiding sugars and simple carbs to prevent getting diabetes.  Considering seeing dietition. Get enough sleep . Walking is good exercise.   change the BP medication.  To add low dose diuretic  DASH diet is helpful also   DASH Diet The DASH diet stands for "Dietary Approaches to Stop Hypertension." It is a healthy eating plan that has been shown to reduce high blood pressure (hypertension) in as little as 14 days, while also possibly providing other significant health benefits. These other health benefits include reducing the risk of breast cancer after menopause and reducing the risk of type 2 diabetes, heart disease, colon cancer, and stroke. Health benefits also include weight loss and slowing kidney failure in patients with chronic kidney disease.  DIET GUIDELINES  Limit salt (sodium). Your diet should contain less than 1500 mg of sodium daily.  Limit refined or processed carbohydrates. Your diet should include mostly whole grains. Desserts and added sugars should be used sparingly.  Include small amounts of heart-healthy fats. These types of fats include nuts, oils, and tub margarine. Limit saturated and trans fats. These fats have been shown to be harmful in the body. CHOOSING FOODS  The following food groups are based on a 2000 calorie diet. See your Registered Dietitian for individual calorie needs. Grains and Grain Products (6 to 8 servings daily)  Eat More Often: Whole-wheat bread, brown rice, whole-grain or wheat pasta, quinoa, popcorn without added fat or salt (air popped).  Eat Less Often: White  bread, white pasta, white rice, cornbread. Vegetables (4 to 5 servings daily)  Eat More Often: Fresh, frozen, and canned vegetables. Vegetables may be raw, steamed, roasted, or grilled with a minimal amount of fat.  Eat Less Often/Avoid: Creamed or fried vegetables. Vegetables in a cheese sauce. Fruit (4 to 5 servings daily)  Eat More Often: All fresh, canned (in natural juice), or frozen fruits. Dried fruits without added sugar. One hundred percent fruit juice ( cup [237 mL] daily).  Eat Less Often: Dried fruits with added sugar. Canned fruit in light or heavy syrup. [employer], Fish, and Poultry (2 servings or less daily. One serving is 3 to 4 oz [85-114 g]).  Eat More Often: Ninety percent or leaner ground beef, tenderloin, sirloin. Round cuts of beef, chicken breast, [place] breast. All fish. Grill, bake, or broil your meat. Nothing should be fried.  Eat Less Often/Avoid: Fatty cuts of meat, [place], or chicken leg, thigh, or wing. Fried cuts of meat or fish. Dairy (2 to 3 servings)  Eat More Often: Low-fat or fat-free milk, low-fat plain or light yogurt, reduced-fat or part-skim cheese.  Eat Less Often/Avoid: Milk (whole, 2%).Whole milk yogurt. Full-fat cheeses. Nuts, Seeds, and Legumes (4 to 5 servings per week)  Eat More Often: All without added salt.  Eat Less Often/Avoid: Salted nuts and seeds, canned beans with added salt. Fats and Sweets (limited)  Eat More Often: Vegetable oils, tub margarines without trans fats, sugar-free gelatin. Mayonnaise and salad dressings.  Eat Less Often/Avoid: Coconut oils, palm oils, butter, stick margarine, cream, half and  half, cookies, candy, pie. FOR MORE INFORMATION The Dash Diet Eating Plan: www.dashdiet.org Document Released: 05/14/2011 Document Revised: 08/17/2011 Document Reviewed: 05/14/2011 [facility] Patient Information 2014 [place], [place].

## 2013-08-29 NOTE — Progress Notes (Signed)
Chief Complaint  Patient presents with  . Annual Exam  . Hypertension    HPI: Patient comes in today for Preventive Health Care visit  No major change in health status since last visit . BP  :   Tends to be 140 range bit high at time new she could have a different lifestyle that would be helpful he thought a lot because of long days at work Has a sweet tooth.   gerd :on nexium per dr Collene Mares . Has helped her good bit. Cough sometimes an issue.  Wt Readings from Last 3 Encounters:  08/29/13 228 lb (103.42 kg)  03/03/13 225 lb 12.8 oz (102.422 kg)  01/06/13 224 lb (101.606 kg)   Health Maintenance  Topic Date Due  . Zostavax  11/07/2011  . Colonoscopy  06/08/2013  . Influenza Vaccine  01/06/2014  . Mammogram  07/10/2015  . Pap Smear  03/03/2016  . Tetanus/tdap  06/08/2017   Health Maintenance Review   ROS:  GEN/ HEENT: No fever, significant weight changes sweats headaches vision problems hearing changes, CV/ PULM; No chest pain shortness of breath cough, syncope,edema  change in exercise tolerance. GI /GU: No adominal pain, vomiting, change in bowel habits. No blood in the stool. No significant GU symptoms. SKIN/HEME: ,no acute skin rashes suspicious lesions or bleeding. No lymphadenopathy, nodules, masses.  NEURO/ PSYCH:  No neurologic signs such as weakness numbness. No depression anxiety. IMM/ Allergy: No unusual infections.  Allergy .   REST of 12 system review negative except as per HPI   Past Medical History  Diagnosis Date  . Chicken pox   . GERD (gastroesophageal reflux disease)   . Diverticulitis   . Hypertension     Family History  Problem Relation Age of Onset  . Hyperlipidemia Mother   . Hypertension Mother   . Hypertension Father   . Hypertension Sister   . Stroke Sister   . Arthritis Sister   . COPD Mother     Deceased  . Cancer - Other Father     Liver deceased  . Obstructive Sleep Apnea Sister     And secondary atrial fibrillation.     History   Social History  . Marital Status: Married    Spouse Name: N/A    Number of Children: N/A  . Years of Education: N/A   Social History Main Topics  . Smoking status: Never Smoker   . Smokeless tobacco: Never Used  . Alcohol Use: Yes     Comment: seldom  . Drug Use: No  . Sexual Activity: Not Currently    Partners: Male    Birth Control/ Protection: Other-see comments     Comment: husband had prostate cancer and BTL   Other Topics Concern  . 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 clobetasol ointment (TEMOVATE) 0.05 % Apply topically as needed.  Marland Kitchen esomeprazole (NEXIUM) 20 MG capsule Take 20 mg by mouth daily at 12 noon.  . [DISCONTINUED] benazepril (LOTENSIN) 20 MG tablet Take 1 tablet (20 mg total) by mouth daily.  . benazepril-hydrochlorthiazide (LOTENSIN HCT) 20-12.5 MG per tablet Take 1 tablet by  mouth daily.    EXAM:  BP 144/80  Pulse 66  Temp(Src) 98.4 F (36.9 C) (Oral)  Ht 5' 2.75" (1.594 m)  Wt 228 lb (103.42 kg)  BMI 40.70 kg/m2  SpO2 98%  LMP 06/08/2004  Body mass index is 40.7 kg/(m^2).  Physical Exam: Vital signs reviewed ZOX:WRUE is a well-developed well-nourished alert cooperative    who appearsr stated age in no acute distress.  HEENT: normocephalic atraumatic , Eyes: PERRL EOM's full, conjunctiva clear, Nares: paten,t no deformity discharge or tenderness., Ears: no deformity EAC's clear TMs with normal landmarks. Mouth: clear OP, no lesions, edema.  Moist mucous membranes. Dentition in adequate repair. NECK: supple without masses, thyromegaly or bruits. CHEST/PULM:  Clear to auscultation and percussion breath sounds equal no wheeze , rales or rhonchi. No  chest wall deformities or tenderness.breast deferred done by GYN CV: PMI is nondisplaced, S1 S2 no gallops, murmurs, rubs. Peripheral pulses are full without delay.No JVD .  ABDOMEN: Bowel sounds normal nontender  No guard or rebound, no hepato splenomegal no CVA tenderness.  No hernia. Extremtities:  No clubbing cyanosis or edema, no acute joint swelling or redness no focal atrophy NEURO:  Oriented x3, cranial nerves 3-12 appear to be intact, no obvious focal weakness,gait within normal limits no abnormal reflexes or asymmetrical SKIN: No acute rashes normal turgor, color, no bruising or petechiae. PSYCH: Oriented, good eye contact, no obvious depression anxiety, cognition and judgment appear normal. LN: no cervical axillary inguinal adenopathy  Lab Results  Component Value Date   WBC 6.9 08/22/2013   HGB 12.0 08/22/2013   HCT 36.2 08/22/2013   PLT 283.0 08/22/2013   GLUCOSE 115* 08/22/2013   CHOL 194 08/22/2013   TRIG 52.0 08/22/2013   HDL 61.40 08/22/2013   LDLCALC 122* 08/22/2013   ALT 18 08/22/2013   AST 18 08/22/2013   NA 142 08/22/2013   K 5.1 08/22/2013   CL 107 08/22/2013   CREATININE 0.9 08/22/2013   BUN 23 08/22/2013   CO2 30 08/22/2013   TSH 2.46 08/22/2013    ASSESSMENT AND PLAN:  Discussed the following assessment and plan:  Visit for preventive health examination  Unspecified essential hypertension - sub optimal control  change add low dose diuretic  and fu  Hyperglycemia - prediabeteic range  counselting check a1c pre visit bmp in 3 months consdier metformin in future if approptriate  GERD (gastroesophageal reflux disease) - endo per dr Collene Mares on nexium   Varicose veins - mild sx  consdier compresion weigh tloss elevations  Patient Care Team: Burnis Medin, MD as PCP - General (Internal Medicine) Juanita Craver, MD as Attending Physician (Gastroenterology) Patient Instructions   lifestyle intervention healthy eating and exercise . 150 minutes of exercise weeks  ,  Lose  weight  To healthy levels. Avoid trans fats and processed foods;  Increase fresh fruits and veges to 5 servings per day. And avoid  All sweet beverages  Including tea and juice. Your blood sugar is in the prediabetic range and you are at risk to develop diabetes .  Let us know how we can help  Avoiding sugars and simple carbs to prevent getting diabetes.  Considering seeing dietition. Get enough sleep . Walking is good exercise.   change the BP medication.  To add low dose diuretic  DASH diet is helpful also   DASH Diet The DASH diet stands for "Dietary Approaches to Stop Hypertension." It is a healthy eating plan that has been shown to reduce  high blood pressure (hypertension) in as little as 14 days, while also possibly providing other significant health benefits. These other health benefits include reducing the risk of breast cancer after menopause and reducing the risk of type 2 diabetes, heart disease, colon cancer, and stroke. Health benefits also include weight loss and slowing kidney failure in patients with chronic kidney disease.  DIET GUIDELINES  Limit salt (sodium). Your diet should contain less than 1500 mg of sodium daily.  Limit refined or processed carbohydrates. Your diet should include mostly whole grains. Desserts and added sugars should be used sparingly.  Include small amounts of heart-healthy fats. These types of fats include nuts, oils, and tub margarine. Limit saturated and trans fats. These fats have been shown to be harmful in the body. CHOOSING FOODS  The following food groups are based on a 2000 calorie diet. See your Registered Dietitian for individual calorie needs. Grains and Grain Products (6 to 8 servings daily)  Eat More Often: Whole-wheat bread, brown rice, whole-grain or wheat pasta, quinoa, popcorn without added fat or salt (air popped).  Eat Less Often: White bread, white pasta, white rice, cornbread. Vegetables (4 to 5 servings daily)  Eat More Often:  Fresh, frozen, and canned vegetables. Vegetables may be raw, steamed, roasted, or grilled with a minimal amount of fat.  Eat Less Often/Avoid: Creamed or fried vegetables. Vegetables in a cheese sauce. Fruit (4 to 5 servings daily)  Eat More Often: All fresh, canned (in natural juice), or frozen fruits. Dried fruits without added sugar. One hundred percent fruit juice ( cup [237 mL] daily).  Eat Less Often: Dried fruits with added sugar. Canned fruit in light or heavy syrup. [employer], Fish, and Poultry (2 servings or less daily. One serving is 3 to 4 oz [85-114 g]).  Eat More Often: Ninety percent or leaner ground beef, tenderloin, sirloin. Round cuts of beef, chicken breast, [place] breast. All fish. Grill, bake, or broil your meat. Nothing should be fried.  Eat Less Often/Avoid: Fatty cuts of meat, [place], or chicken leg, thigh, or wing. Fried cuts of meat or fish. Dairy (2 to 3 servings)  Eat More Often: Low-fat or fat-free milk, low-fat plain or light yogurt, reduced-fat or part-skim cheese.  Eat Less Often/Avoid: Milk (whole, 2%).Whole milk yogurt. Full-fat cheeses. Nuts, Seeds, and Legumes (4 to 5 servings per week)  Eat More Often: All without added salt.  Eat Less Often/Avoid: Salted nuts and seeds, canned beans with added salt. Fats and Sweets (limited)  Eat More Often: Vegetable oils, tub margarines without trans fats, sugar-free gelatin. Mayonnaise and salad dressings.  Eat Less Often/Avoid: Coconut oils, palm oils, butter, stick margarine, cream, half and half, cookies, candy, pie. FOR MORE INFORMATION The Dash Diet Eating Plan: www.dashdiet.org Document Released: 05/14/2011 Document Revised: 08/17/2011 Document Reviewed: 05/14/2011 [facility] Patient Information 2014 [place], [place].        Standley Brooking. Melanie Pellot M.D.   Pre visit review using our clinic review tool, if applicable. No additional management support is needed unless otherwise documented below in the  visit note.

## 2013-08-30 ENCOUNTER — Telehealth: Payer: Self-pay | Admitting: Internal Medicine

## 2013-08-30 NOTE — Telephone Encounter (Signed)
Relevant patient education assigned to patient using Emmi. ° °

## 2013-10-23 ENCOUNTER — Encounter: Payer: Self-pay | Admitting: Sports Medicine

## 2013-10-23 ENCOUNTER — Ambulatory Visit (INDEPENDENT_AMBULATORY_CARE_PROVIDER_SITE_OTHER): Payer: BC Managed Care – PPO | Admitting: Sports Medicine

## 2013-10-23 VITALS — BP 146/84 | Ht 63.0 in | Wt 220.0 lb

## 2013-10-23 DIAGNOSIS — M6789 Other specified disorders of synovium and tendon, multiple sites: Secondary | ICD-10-CM

## 2013-10-23 DIAGNOSIS — M722 Plantar fascial fibromatosis: Secondary | ICD-10-CM | POA: Insufficient documentation

## 2013-10-23 NOTE — Progress Notes (Signed)
   Subjective:    Patient ID: [name], female    DOB: Jul 30, 1950, 63 y.o.   MRN: [id number]  HPI chief complaint: Right heel pain  Very pleasant 63 year old female comes in today complaining of 5-6 months of right heel pain. No injury that she can recall but gradual onset of pain that she localizes to the medial aspect of the plantar heel. She denies any pain elsewhere in the foot or ankle. Pain is worse first thing in the morning as well as at the end of the day. She denies any associated numbness or tingling. No similar problems in the past. She has tried some topical Voltaren gel which has not been beneficial. She's also tried some sort of pain gel cups which have been only minimally helpful. Otherwise, no other treatment. No pain in the ankle.  Past medical history and current medications reviewed. She has a history of GERD Allergies review She denies tobacco use. Occasional alcohol use. 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 No acute distress. Awake alert and oriented x3. Vital signs reviewed.  Right heel: Tenderness to palpation at the origin of the plantar fascia. Negative calcaneal squeeze. No soft tissue swelling. Cavus foot. Full ankle range of motion without effusion. Negative Tinel's over the tarsal tunnel. No tenderness to palpation along the Achilles tendon. Neurovascularly intact distally. Walking with a slight limp.  MSK ultrasound of the right heel. Limited images were obtained. There is thickening as well as diffuse hypoechoic changes throughout the proximal plantar fascia consistent with plantar fasciitis. Small calcaneal spur seen as well. Achilles tendon is within normal limits.       Assessment & Plan:  Plantar fasciitis, right heel  Plantar fascial stretching. Green sports insoles for cushioning. Arch strap to wear when active. Daily icing. Education regarding the  recalcitrant nature of this condition. Followup in 6 weeks. Call with questions or concerns in the interim.

## 2013-11-24 ENCOUNTER — Other Ambulatory Visit (INDEPENDENT_AMBULATORY_CARE_PROVIDER_SITE_OTHER): Payer: BC Managed Care – PPO

## 2013-11-24 DIAGNOSIS — R7309 Other abnormal glucose: Secondary | ICD-10-CM

## 2013-11-24 LAB — BASIC METABOLIC PANEL
BUN: 22 mg/dL (ref 6–23)
CO2: 30 mEq/L (ref 19–32)
Calcium: 9.1 mg/dL (ref 8.4–10.5)
Chloride: 103 mEq/L (ref 96–112)
Creatinine, Ser: 0.8 mg/dL (ref 0.4–1.2)
GFR: 80.72 mL/min (ref >60.00)
Glucose, Bld: 120 mg/dL — ABNORMAL HIGH (ref 70–99)
POTASSIUM: 3.6 meq/L (ref 3.5–5.1)
SODIUM: 139 meq/L (ref 135–145)

## 2013-11-24 LAB — HEMOGLOBIN A1C: HEMOGLOBIN A1C: 6.3 % (ref 4.6–6.5)

## 2013-12-01 ENCOUNTER — Encounter: Payer: Self-pay | Admitting: Internal Medicine

## 2013-12-01 ENCOUNTER — Ambulatory Visit (INDEPENDENT_AMBULATORY_CARE_PROVIDER_SITE_OTHER): Payer: BC Managed Care – PPO | Admitting: Internal Medicine

## 2013-12-01 VITALS — BP 126/60 | Temp 98.6°F | Ht 62.75 in | Wt 228.0 lb

## 2013-12-01 DIAGNOSIS — R7309 Other abnormal glucose: Secondary | ICD-10-CM

## 2013-12-01 DIAGNOSIS — I10 Essential (primary) hypertension: Secondary | ICD-10-CM

## 2013-12-01 DIAGNOSIS — R7303 Prediabetes: Secondary | ICD-10-CM

## 2013-12-01 NOTE — Progress Notes (Signed)
Pre visit review using our clinic review tool, if applicable. No additional management support is needed unless otherwise documented below in the visit note.  Chief Complaint  Patient presents with  . Follow-up    HPI:   FU of HT added low dose diuretic alst visit   bp readings   Good .  Preidiabetes  consdiering  Metformin :  Trying  To stop iced tea.  Other interventions Seen for plantar fasciitis somewhat limiting.  No change in her health otherwise. ROS: See pertinent positives and negatives per HPI.  Past Medical History  Diagnosis Date  . Chicken pox   . GERD (gastroesophageal reflux disease)   . Diverticulitis   . Hypertension     Family History  Problem Relation Age of Onset  . Hyperlipidemia Mother   . Hypertension Mother   . Hypertension Father   . Hypertension Sister   . Stroke Sister   . Arthritis Sister   . COPD Mother     Deceased  . Cancer - Other Father     Liver deceased  . Obstructive Sleep Apnea Sister     And secondary atrial fibrillation.    History   Social History  . Marital Status: Married    Spouse Name: N/A    Number of Children: N/A  . Years of Education: N/A   Social History Main Topics  . Smoking status: Never Smoker   . Smokeless tobacco: Never Used  . Alcohol Use: Yes     Comment: seldom  . Drug Use: No  . Sexual Activity: Not Currently    Partners: Male    Birth Control/ Protection: Other-see comments     Comment: husband had prostate cancer and BTL   Other Topics Concern  . 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 benazepril-hydrochlorthiazide (LOTENSIN HCT) 20-12.5 MG per tablet Take 1 tablet by mouth daily.  . clobetasol ointment  (TEMOVATE) 0.05 % Apply topically as needed.  Marland Kitchen esomeprazole (NEXIUM) 20 MG capsule Take 20 mg by mouth daily at 12 noon.  . [DISCONTINUED] benazepril (LOTENSIN) 20 MG tablet     EXAM:  BP 126/60  Temp(Src) 98.6 F (37 C) (Oral)  Ht 5' 2.75" (1.594 m)  Wt 228 lb (103.42 kg)  BMI 40.70 kg/m2  LMP 06/08/2004  Body mass index is 40.7 kg/(m^2). Wt Readings from Last 3 Encounters:  12/01/13 228 lb (103.42 kg)  10/23/13 220 lb (99.791 kg)  08/29/13 228 lb (103.42 kg)    GENERAL: vitals reviewed and listed above, alert, oriented,appears well hydrated and in no acute distress PSYCH: pleasant and cooperative, no obvious depression or anxiety Lab Results  Component Value Date   WBC 6.9 08/22/2013   HGB 12.0 08/22/2013   HCT 36.2 08/22/2013   PLT 283.0 08/22/2013   GLUCOSE 120* 11/24/2013   CHOL 194 08/22/2013   TRIG 52.0 08/22/2013   HDL 61.40 08/22/2013   LDLCALC 122* 08/22/2013   ALT 18 08/22/2013   AST 18 08/22/2013   NA 139 11/24/2013   K 3.6 11/24/2013   CL 103 11/24/2013   CREATININE 0.8 11/24/2013   BUN 22 11/24/2013  CO2 30 11/24/2013   TSH 2.46 08/22/2013   HGBA1C 6.3 11/24/2013    ASSESSMENT AND PLAN:  Discussed the following assessment and plan:  Unspecified essential hypertension - better  Prediabetes - disc insteification ways to cut pout cookeis processed carbs contiue ssweet free beverages   Severe obesity (BMI >= 40) Wants to hold off on medication at this time counseling and close followup. Strategies discussed. Cookies out of the house. Etc. -Patient advised to return or notify health care team  if symptoms worsen ,persist or new concerns arise.  Patient Instructions  Intensify lifestyle interventions. Work on the processed carbs now bp is much better  Get your colonoscopy this year. Plan hga1c pre visit . 4 months if you fast we can do fasting bg in office also    Mariann Laster K. Panosh M.D. Total visit 28mins > 50% spent counseling and coordinating care

## 2013-12-01 NOTE — Patient Instructions (Signed)
Intensify lifestyle interventions. Work on the processed carbs now bp is much better  Get your colonoscopy this year. Plan hga1c pre visit . 4 months if you fast we can do fasting bg in office also

## 2013-12-04 ENCOUNTER — Ambulatory Visit: Payer: BC Managed Care – PPO | Admitting: Sports Medicine

## 2014-03-23 ENCOUNTER — Other Ambulatory Visit (INDEPENDENT_AMBULATORY_CARE_PROVIDER_SITE_OTHER): Payer: BC Managed Care – PPO

## 2014-03-23 ENCOUNTER — Ambulatory Visit: Payer: BC Managed Care – PPO | Admitting: Obstetrics & Gynecology

## 2014-03-23 DIAGNOSIS — E119 Type 2 diabetes mellitus without complications: Secondary | ICD-10-CM

## 2014-03-23 LAB — HEMOGLOBIN A1C: HEMOGLOBIN A1C: 6 % (ref 4.6–6.5)

## 2014-03-23 LAB — MICROALBUMIN / CREATININE URINE RATIO
CREATININE, U: 53.9 mg/dL
MICROALB UR: 0.2 mg/dL (ref 0.0–1.9)
MICROALB/CREAT RATIO: 0.4 mg/g (ref 0.0–30.0)

## 2014-03-30 ENCOUNTER — Encounter: Payer: Self-pay | Admitting: Internal Medicine

## 2014-03-30 ENCOUNTER — Ambulatory Visit (INDEPENDENT_AMBULATORY_CARE_PROVIDER_SITE_OTHER): Payer: BC Managed Care – PPO | Admitting: Internal Medicine

## 2014-03-30 VITALS — BP 126/70 | Temp 98.3°F | Wt 219.0 lb

## 2014-03-30 DIAGNOSIS — R739 Hyperglycemia, unspecified: Secondary | ICD-10-CM

## 2014-03-30 DIAGNOSIS — I10 Essential (primary) hypertension: Secondary | ICD-10-CM

## 2014-03-30 LAB — POCT CBG (FASTING - GLUCOSE)-MANUAL ENTRY: GLUCOSE FASTING, POC: 101 mg/dL — AB (ref 70–99)

## 2014-03-30 NOTE — Progress Notes (Signed)
Chief Complaint  Patient presents with  . 4 month follow up    HPI: [name] 63 y.o. comes in for  Ht and elevated bg  No sweet tea and no cookies and candies . Feeling good better  Losing weight . No new sx feflux controlled by zantac   nexium too expensive  ROS: See pertinent positives and negatives per HPI. No cough cp sob   Past Medical History  Diagnosis Date  . Chicken pox   . GERD (gastroesophageal reflux disease)   . Diverticulitis   . Hypertension     Family History  Problem Relation Age of Onset  . Hyperlipidemia Mother   . Hypertension Mother   . Hypertension Father   . Hypertension Sister   . Stroke Sister   . Arthritis Sister   . COPD Mother     Deceased  . Cancer - Other Father     Liver deceased  . Obstructive Sleep Apnea Sister     And secondary atrial fibrillation.    History   Social History  . Marital Status: Married    Spouse Name: N/A    Number of Children: N/A  . Years of Education: N/A   Social History Main Topics  . Smoking status: Never Smoker   . Smokeless tobacco: Never Used  . Alcohol Use: Yes     Comment: seldom  . Drug Use: No  . Sexual Activity: Not Currently    Partners: Male    Birth Control/ Protection: Other-see comments     Comment: husband had prostate cancer and BTL   Other Topics Concern  . 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 benazepril-hydrochlorthiazide (LOTENSIN HCT) 20-12.5 MG per tablet Take 1 tablet by mouth daily.  . ranitidine (ZANTAC) 75 MG tablet Take 75 mg by mouth at bedtime.  . [DISCONTINUED] esomeprazole (NEXIUM) 20 MG capsule Take 20 mg by mouth daily at 12 noon.  . clobetasol ointment (TEMOVATE) 0.05 %  Apply topically as needed.    EXAM:  BP 126/70  Temp(Src) 98.3 F (36.8 C) (Oral)  Wt 219 lb (99.338 kg)  LMP 06/08/2004  Body mass index is 39.1 kg/(m^2).  GENERAL: vitals reviewed and listed above, alert, oriented, appears well hydrated and in no acute distress  PSYCH: pleasant and cooperative, no obvious depression or anxiety BP Readings from Last 3 Encounters:  03/30/14 126/70  12/01/13 126/60  10/23/13 146/84   Wt Readings from Last 3 Encounters:  03/30/14 219 lb (99.338 kg)  12/01/13 228 lb (103.42 kg)  10/23/13 220 lb (99.791 kg)   Labs reviewed  Lab Results  Component Value Date   WBC 6.9 08/22/2013   HGB 12.0 08/22/2013   HCT 36.2 08/22/2013   PLT 283.0 08/22/2013   GLUCOSE 120* 11/24/2013   CHOL 194 08/22/2013   TRIG 52.0 08/22/2013   HDL 61.40 08/22/2013   LDLCALC 122* 08/22/2013   ALT 18 08/22/2013   AST 18 08/22/2013   NA 139 11/24/2013   K 3.6 11/24/2013   CL 103 11/24/2013   CREATININE 0.8 11/24/2013   BUN 22 11/24/2013   CO2 30  11/24/2013   TSH 2.46 08/22/2013   HGBA1C 6.0 03/23/2014   MICROALBUR 0.2 03/23/2014    ASSESSMENT AND PLAN:  Discussed the following assessment and plan:  Hyperglycemia - much better a1c  lsi down to 6 follwo constinu lsi  consifder metfrmoin in future if needed .  - Plan: POCT CBG (Fasting - Glucose)  Essential hypertension - good control bmi now below 40  39 -Patient advised to return or notify health care team  if symptoms worsen ,persist or new concerns arise.  Patient Instructions  Continue lifestyle intervention healthy eating and exercise . KEEP going.... Weight loss  Should help the  Reflux also .  CPX and hgA1c pre cpx visit .   Standley Brooking. Panosh M.D. Total visit 85mins > 50% spent counseling and coordinating care

## 2014-03-30 NOTE — Patient Instructions (Addendum)
Continue lifestyle intervention healthy eating and exercise . KEEP going.... Weight loss  Should help the  Reflux also .  CPX and hgA1c pre cpx visit .

## 2014-04-09 ENCOUNTER — Encounter: Payer: Self-pay | Admitting: Internal Medicine

## 2014-05-07 ENCOUNTER — Other Ambulatory Visit: Payer: Self-pay | Admitting: Internal Medicine

## 2014-05-07 DIAGNOSIS — Z1231 Encounter for screening mammogram for malignant neoplasm of breast: Secondary | ICD-10-CM

## 2014-05-10 ENCOUNTER — Ambulatory Visit (HOSPITAL_COMMUNITY)
Admission: RE | Admit: 2014-05-10 | Discharge: 2014-05-10 | Disposition: A | Discharge status: Home / Self Care | Payer: BC Managed Care – PPO | Source: Ambulatory Visit | Attending: Internal Medicine | Admitting: Internal Medicine

## 2014-05-10 DIAGNOSIS — Z1231 Encounter for screening mammogram for malignant neoplasm of breast: Secondary | ICD-10-CM | POA: Insufficient documentation

## 2014-08-10 ENCOUNTER — Ambulatory Visit (INDEPENDENT_AMBULATORY_CARE_PROVIDER_SITE_OTHER): Payer: 59 | Admitting: Family Medicine

## 2014-08-10 ENCOUNTER — Encounter: Payer: Self-pay | Admitting: Family Medicine

## 2014-08-10 VITALS — BP 130/78 | HR 129 | Temp 101.2°F | Ht 62.75 in | Wt 215.0 lb

## 2014-08-10 DIAGNOSIS — R5081 Fever presenting with conditions classified elsewhere: Secondary | ICD-10-CM

## 2014-08-10 DIAGNOSIS — R509 Fever, unspecified: Secondary | ICD-10-CM

## 2014-08-10 DIAGNOSIS — B349 Viral infection, unspecified: Secondary | ICD-10-CM

## 2014-08-10 LAB — POCT INFLUENZA A/B
INFLUENZA A, POC: NEGATIVE
Influenza B, POC: NEGATIVE

## 2014-08-10 NOTE — Progress Notes (Signed)
Pre visit review using our clinic review tool, if applicable. No additional management support is needed unless otherwise documented below in the visit note. 

## 2014-08-10 NOTE — Progress Notes (Signed)
   Subjective:    Patient ID: [name], female    DOB: 07/26/1950, 64 y.o.   MRN: [id number]  HPI Here for 2 days of fever to 101 degrees, body aches, ST, mild HA, and a dry cough. No NVD. On Advil.   Review of Systems  Constitutional: Positive for fever.  HENT: Positive for congestion. Negative for postnasal drip and sinus pressure.   Eyes: Negative.   Respiratory: Positive for cough.   Gastrointestinal: Negative.        Objective:   Physical Exam  Constitutional: She appears well-developed and well-nourished.  HENT:  Right Ear: External ear normal.  Left Ear: External ear normal.  Nose: Nose normal.  Mouth/Throat: Oropharynx is clear and moist. No oropharyngeal exudate.  Eyes: Conjunctivae are normal.  Pulmonary/Chest: Effort normal and breath sounds normal.  Lymphadenopathy:    She has no cervical adenopathy.          Assessment & Plan:  This is a flu-like illness. Rest, drink fluids, use Ibuprofen prn.

## 2014-08-13 ENCOUNTER — Telehealth: Payer: Self-pay | Admitting: Internal Medicine

## 2014-08-13 DIAGNOSIS — H109 Unspecified conjunctivitis: Secondary | ICD-10-CM

## 2014-08-13 NOTE — Telephone Encounter (Signed)
LMOM for the pt to return my call.  Need to know what kind of sx she is having.

## 2014-08-13 NOTE — Telephone Encounter (Signed)
Order placed in the system. 

## 2014-08-13 NOTE — Telephone Encounter (Signed)
Pt called she need a referral to see her eye doctor Dr Valetta Close at [facility]

## 2014-08-13 NOTE — Telephone Encounter (Signed)
Misty pt called back to advised she  went to see  Dr Valetta Close and he diagnosed her with viral conjunctivitis. She contacted [employer] .They went ahead and saw her and they need the referral sent over by tomorrow.

## 2014-08-27 ENCOUNTER — Other Ambulatory Visit: Payer: Self-pay | Admitting: Internal Medicine

## 2014-08-27 NOTE — Telephone Encounter (Signed)
Sent to the pharmacy.  Pt has CPX on 10/05/14

## 2014-09-28 ENCOUNTER — Other Ambulatory Visit (INDEPENDENT_AMBULATORY_CARE_PROVIDER_SITE_OTHER): Payer: 59

## 2014-09-28 DIAGNOSIS — Z0000 Encounter for general adult medical examination without abnormal findings: Secondary | ICD-10-CM

## 2014-09-28 LAB — CBC WITH DIFFERENTIAL/PLATELET
BASOS ABS: 0 10*3/uL (ref 0.0–0.1)
Basophils Relative: 0.4 % (ref 0.0–3.0)
EOS ABS: 0.4 10*3/uL (ref 0.0–0.7)
Eosinophils Relative: 4.8 % (ref 0.0–5.0)
HCT: 35 % — ABNORMAL LOW (ref 36.0–46.0)
Hemoglobin: 11.9 g/dL — ABNORMAL LOW (ref 12.0–15.0)
LYMPHS PCT: 28.3 % (ref 12.0–46.0)
Lymphs Abs: 2.1 10*3/uL (ref 0.7–4.0)
MCHC: 34 g/dL (ref 30.0–36.0)
MCV: 87.5 fl (ref 78.0–100.0)
Monocytes Absolute: 0.4 10*3/uL (ref 0.1–1.0)
Monocytes Relative: 6 % (ref 3.0–12.0)
NEUTROS PCT: 60.5 % (ref 43.0–77.0)
Neutro Abs: 4.4 10*3/uL (ref 1.4–7.7)
Platelets: 271 10*3/uL (ref 150.0–400.0)
RBC: 4 Mil/uL (ref 3.87–5.11)
RDW: 14 % (ref 11.5–15.5)
WBC: 7.3 10*3/uL (ref 4.0–10.5)

## 2014-09-28 LAB — BASIC METABOLIC PANEL
BUN: 24 mg/dL — ABNORMAL HIGH (ref 6–23)
CO2: 31 mEq/L (ref 19–32)
Calcium: 9 mg/dL (ref 8.4–10.5)
Chloride: 103 mEq/L (ref 96–112)
Creatinine, Ser: 0.79 mg/dL (ref 0.40–1.20)
GFR: 78.15 mL/min (ref >60.00)
GLUCOSE: 103 mg/dL — AB (ref 70–99)
POTASSIUM: 4.2 meq/L (ref 3.5–5.1)
SODIUM: 140 meq/L (ref 135–145)

## 2014-09-28 LAB — LIPID PANEL
CHOLESTEROL: 174 mg/dL (ref 0–200)
HDL: 56 mg/dL (ref >39.00)
LDL Cholesterol: 104 mg/dL — ABNORMAL HIGH (ref 0–99)
NonHDL: 118
TRIGLYCERIDES: 68 mg/dL (ref 0.0–149.0)
Total CHOL/HDL Ratio: 3
VLDL: 13.6 mg/dL (ref 0.0–40.0)

## 2014-09-28 LAB — HEPATIC FUNCTION PANEL
ALK PHOS: 69 U/L (ref 39–117)
ALT: 13 U/L (ref 0–35)
AST: 14 U/L (ref 0–37)
Albumin: 3.7 g/dL (ref 3.5–5.2)
BILIRUBIN DIRECT: 0.1 mg/dL (ref 0.0–0.3)
BILIRUBIN TOTAL: 0.3 mg/dL (ref 0.2–1.2)
TOTAL PROTEIN: 6.7 g/dL (ref 6.0–8.3)

## 2014-09-28 LAB — TSH: TSH: 3.08 u[IU]/mL (ref 0.35–4.50)

## 2014-09-28 LAB — HEMOGLOBIN A1C: Hgb A1c MFr Bld: 5.8 % (ref 4.6–6.5)

## 2014-10-05 ENCOUNTER — Ambulatory Visit (INDEPENDENT_AMBULATORY_CARE_PROVIDER_SITE_OTHER): Payer: 59 | Admitting: Internal Medicine

## 2014-10-05 ENCOUNTER — Encounter: Payer: Self-pay | Admitting: Internal Medicine

## 2014-10-05 VITALS — BP 120/70 | Temp 97.9°F | Ht 62.75 in | Wt 215.1 lb

## 2014-10-05 DIAGNOSIS — R739 Hyperglycemia, unspecified: Secondary | ICD-10-CM | POA: Diagnosis not present

## 2014-10-05 DIAGNOSIS — D649 Anemia, unspecified: Secondary | ICD-10-CM | POA: Diagnosis not present

## 2014-10-05 DIAGNOSIS — Z0000 Encounter for general adult medical examination without abnormal findings: Secondary | ICD-10-CM

## 2014-10-05 DIAGNOSIS — I10 Essential (primary) hypertension: Secondary | ICD-10-CM | POA: Diagnosis not present

## 2014-10-05 LAB — CBC WITH DIFFERENTIAL/PLATELET
Basophils Absolute: 0 10*3/uL (ref 0.0–0.1)
Basophils Relative: 0.5 % (ref 0.0–3.0)
EOS PCT: 3.3 % (ref 0.0–5.0)
Eosinophils Absolute: 0.3 10*3/uL (ref 0.0–0.7)
HCT: 36.4 % (ref 36.0–46.0)
Hemoglobin: 12.4 g/dL (ref 12.0–15.0)
Lymphocytes Relative: 20.7 % (ref 12.0–46.0)
Lymphs Abs: 1.8 10*3/uL (ref 0.7–4.0)
MCHC: 34.2 g/dL (ref 30.0–36.0)
MCV: 86.9 fl (ref 78.0–100.0)
Monocytes Absolute: 0.4 10*3/uL (ref 0.1–1.0)
Monocytes Relative: 5.2 % (ref 3.0–12.0)
Neutro Abs: 6.1 10*3/uL (ref 1.4–7.7)
Neutrophils Relative %: 70.3 % (ref 43.0–77.0)
Platelets: 277 10*3/uL (ref 150.0–400.0)
RBC: 4.19 Mil/uL (ref 3.87–5.11)
RDW: 13.6 % (ref 11.5–15.5)
WBC: 8.6 10*3/uL (ref 4.0–10.5)

## 2014-10-05 LAB — POCT URINALYSIS DIP (MANUAL ENTRY)
Bilirubin, UA: NEGATIVE
GLUCOSE UA: NEGATIVE
Ketones, POC UA: NEGATIVE
Leukocytes, UA: NEGATIVE
Nitrite, UA: NEGATIVE
Protein Ur, POC: NEGATIVE
Spec Grav, UA: 1.015
Urobilinogen, UA: 1
pH, UA: 6.5

## 2014-10-05 LAB — IBC PANEL
Iron: 54 ug/dL (ref 42–145)
Saturation Ratios: 15.1 % — ABNORMAL LOW (ref 20.0–50.0)
Transferrin: 256 mg/dL (ref 212.0–360.0)

## 2014-10-05 LAB — FERRITIN: FERRITIN: 73.3 ng/mL (ref 10.0–291.0)

## 2014-10-05 LAB — VITAMIN B12: Vitamin B-12: 170 pg/mL — ABNORMAL LOW (ref 211–911)

## 2014-10-05 NOTE — Progress Notes (Signed)
Pre visit review using our clinic review tool, if applicable. No additional management support is needed unless otherwise documented below in the visit note.  Chief Complaint  Patient presents with  . Annual Exam    HPI: Patient  [name]  64 y.o. comes in today for [place] visit  Doing well  Trying to eat healthy diet not a lot of exercise except work . Feels pretty good Bp has been ok Gets eye check recurrent HSV right rx with valtrex  No bleeding stool urine  Problems  No hx blood donation is due for colon this year  Dr [name]   Health Maintenance  Topic Date Due  . PNEUMOCOCCAL POLYSACCHARIDE VACCINE (1) 11/06/1953  . OPHTHALMOLOGY EXAM  11/06/1961  . ZOSTAVAX  11/07/2011  . COLONOSCOPY  06/08/2013  . HIV Screening  09/09/2015 (Originally 11/07/1966)  . INFLUENZA VACCINE  01/07/2015  . URINE MICROALBUMIN  03/24/2015  . HEMOGLOBIN A1C  03/30/2015  . FOOT EXAM  10/05/2015  . PAP SMEAR  03/03/2016  . MAMMOGRAM  05/10/2016  . TETANUS/TDAP  06/08/2017   Health Maintenance Review LIFESTYLE:  Exercise:   Works 40 hours  8000 Tobacco/ETS:no Alcohol: 2 x per month  Sugar beverages: given up. Sleep: about 7  Drug use: no   Colonoscopy:   Due waiting to get on medicare cause of deductable. PAP: to see dr [name]  MAMMO: utd    ROS:  GEN/ HEENT: No fever, significant weight changes sweats headaches vision problems hearing changes, CV/ PULM; No chest pain shortness of breath cough, syncope,edema  change in exercise tolerance. GI /GU: No adominal pain, vomiting, change in bowel habits. No blood in the stool. No significant GU symptoms. SKIN/HEME: ,no acute skin rashes suspicious lesions or bleeding. No lymphadenopathy, nodules, masses.  NEURO/ PSYCH:  No neurologic signs such as weakness numbness. No depression anxiety. IMM/ Allergy: No unusual infections.  Allergy .   REST of 12 system review negative except as per HPI   Past Medical History  Diagnosis  Date  . Hx of varicella   . GERD (gastroesophageal reflux disease)   . Diverticulitis   . Hypertension   . 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 Cesarean section      2  . Cholecystectomy  2005  . Tubal ligation      BTL    Family History  Problem Relation Age of Onset  . Hyperlipidemia Mother   . Hypertension Mother   . Hypertension Father   . Hypertension Sister   . Stroke Sister   . Arthritis Sister   . COPD Mother     Deceased  . Cancer - Other Father     Liver deceased  . Obstructive Sleep Apnea Sister     And secondary atrial fibrillation.    History   Social History  . Marital Status: Married    Spouse Name: N/A  . Number of Children: N/A  . Years of Education: N/A   Social History Main Topics  . Smoking status: Never Smoker   . Smokeless tobacco: Never Used  . Alcohol Use: 0.0 oz/week    0 Standard drinks or equivalent per week     Comment: seldom  . Drug Use: No  . Sexual Activity:    Partners: Male    Birth Control/ Protection: Other-see comments  Comment: husband had prostate cancer and BTL   Other Topics Concern  . 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 benazepril-hydrochlorthiazide (LOTENSIN HCT) 20-12.5 MG per tablet TAKE 1 TABLET BY MOUTH DAILY.  . clobetasol ointment (TEMOVATE) 0.05 % Apply topically as needed.  . ranitidine (ZANTAC) 75 MG tablet Take 75 mg by mouth at bedtime.    EXAM:  BP 120/70 mmHg  Temp(Src) 97.9 F (36.6 C) (Oral)  Ht 5' 2.75" (1.594 m)  Wt 215 lb 1.6 oz (97.569 kg)  BMI 38.40 kg/m2  LMP 06/08/2004  Body mass index  is 38.4 kg/(m^2).  Physical Exam: Vital signs reviewed [name] is a well-developed well-nourished alert cooperative    who appearsr stated age in no acute distress.  HEENT: normocephalic atraumatic , Eyes: PERRL EOM's full, conjunctiva clear, Nares: paten,t no deformity discharge or tenderness., Ears: no deformity EAC's clear TMs with normal landmarks. Mouth: clear OP, no lesions, edema.  Moist mucous membranes. Dentition in adequate repair. NECK: supple without masses, thyromegaly or bruits. CHEST/PULM:  Clear to auscultation and percussion breath sounds equal no wheeze , rales or rhonchi. No chest wall deformities or tenderness.  CV: PMI is nondisplaced, S1 S2 no gallops, murmurs, rubs. Peripheral pulses are full without delay.No JVD .  ABDOMEN: Bowel sounds normal nontender  No guard or rebound, no hepato splenomegal no CVA tenderness.  No hernia. Extremtities:  No clubbing cyanosis or edema, no acute joint swelling or redness no focal atrophy NEURO:  Oriented x3, cranial nerves 3-12 appear to be intact, no obvious focal weakness,gait within normal limits no abnormal reflexes or asymmetrical SKIN: No acute rashes normal turgor, color, no bruising or petechiae. PSYCH: Oriented, good eye contact, no obvious depression anxiety, cognition and judgment appear normal. LN: no cervical axillary inguinal adenopathy Pelvic per Gyne dr [name] to have appt May   BP Readings from Last 3 Encounters:  10/05/14 120/70  08/10/14 130/78  03/30/14 126/70   Wt Readings from Last 3 Encounters:  10/05/14 215 lb 1.6 oz (97.569 kg)  08/10/14 215 lb (97.523 kg)  03/30/14 219 lb (99.338 kg)    ASSESSMENT AND PLAN:  Discussed the following assessment and plan:  Visit for preventive health examination - Plan: POCT urinalysis dipstick  Essential hypertension - controlled  Anemia, unspecified anemia type - bordeline    new problem may not be sig but get repeat and iron studoes  dis  colon screen due   decline hemoccults  - Plan: CBC with Differential/Platelet, IBC panel, Ferritin, Vitamin B12  Hyperglycemia - better  const lsi Anemia is new borderline albeit  Will recheck  With levels pt decline stool cards  Cause prob wont be able to do this . Advise get colon on time  And not wait    To be sure Recheck labs today  Fu as appropriate.   Patient Care Team: Burnis Medin, MD as PCP - General (Internal Medicine) Juanita Craver, MD as Attending Physician (Gastroenterology) Jola Schmidt, MD as Consulting Physician (Ophthalmology) Megan Salon, MD as Consulting Physician (Gynecology) Patient Instructions  Continue lifestyle intervention healthy eating and exercise . Your blood  sugar is better borderline anemia  Get your colonoscopy  With dr [name] as discussed  Will notify you  of labs when available.  Yearly wellness visit and lab s with hga1c      Standley Brooking. Shanda Cadotte M.D.  Lab Results  Component Value Date   WBC 8.6 10/05/2014   HGB 12.4 10/05/2014   HCT 36.4 10/05/2014   PLT 277.0 10/05/2014   GLUCOSE 103* 09/28/2014   CHOL 174 09/28/2014   TRIG 68.0 09/28/2014   HDL 56.00 09/28/2014   LDLCALC 104* 09/28/2014   ALT 13 09/28/2014   AST 14 09/28/2014   NA 140 09/28/2014   K 4.2 09/28/2014   CL 103 09/28/2014   CREATININE 0.79 09/28/2014   BUN 24* 09/28/2014   CO2 31 09/28/2014   TSH 3.08 09/28/2014   HGBA1C 5.8 09/28/2014   MICROALBUR 0.2 03/23/2014

## 2014-10-05 NOTE — Patient Instructions (Signed)
Continue lifestyle intervention healthy eating and exercise . Your blood sugar is better borderline anemia  Get your colonoscopy  With dr Collene Mares as discussed  Will notify you  of labs when available.  Yearly wellness visit and lab s with hga1c

## 2014-10-06 ENCOUNTER — Encounter: Payer: Self-pay | Admitting: Internal Medicine

## 2014-10-06 DIAGNOSIS — D649 Anemia, unspecified: Secondary | ICD-10-CM | POA: Insufficient documentation

## 2014-10-19 ENCOUNTER — Encounter: Payer: Self-pay | Admitting: Obstetrics & Gynecology

## 2014-10-19 ENCOUNTER — Ambulatory Visit (INDEPENDENT_AMBULATORY_CARE_PROVIDER_SITE_OTHER): Payer: 59 | Admitting: Obstetrics & Gynecology

## 2014-10-19 VITALS — BP 138/68 | HR 60 | Resp 16 | Ht 63.0 in | Wt 215.0 lb

## 2014-10-19 DIAGNOSIS — R312 Other microscopic hematuria: Secondary | ICD-10-CM | POA: Diagnosis not present

## 2014-10-19 DIAGNOSIS — Z124 Encounter for screening for malignant neoplasm of cervix: Secondary | ICD-10-CM | POA: Diagnosis not present

## 2014-10-19 DIAGNOSIS — Z1211 Encounter for screening for malignant neoplasm of colon: Secondary | ICD-10-CM

## 2014-10-19 DIAGNOSIS — Z01419 Encounter for gynecological examination (general) (routine) without abnormal findings: Secondary | ICD-10-CM | POA: Diagnosis not present

## 2014-10-19 DIAGNOSIS — R3129 Other microscopic hematuria: Secondary | ICD-10-CM

## 2014-10-19 DIAGNOSIS — Z0000 Encounter for general adult medical examination without abnormal findings: Secondary | ICD-10-CM | POA: Diagnosis not present

## 2014-10-19 LAB — POCT URINALYSIS DIPSTICK
Bilirubin, UA: NEGATIVE
Glucose, UA: NEGATIVE
KETONES UA: NEGATIVE
Nitrite, UA: NEGATIVE
PH UA: 5
Protein, UA: NEGATIVE
UROBILINOGEN UA: NEGATIVE

## 2014-10-19 NOTE — Progress Notes (Signed)
64 y.o. [id number] MarriedCaucasianF here for annual exam.  Doing well.    Patient's last menstrual period was 06/08/2004.          Sexually active: No.  The current method of family planning is tubal ligation.    Exercising: Yes.     Smoker:  no  Health Maintenance: Pap:  03/03/13 WNL, neg HR HPV 2012 History of abnormal Pap:  Yes years ago MMG:  05/10/14 3D-normal Colonoscopy:  2006-repeat in 10 years Dr Collene Mares.  Pt declines doing it this year.   BMD:   2010-normal per patient TDaP:  1/09  Screening Labs: PCP, Hb today: PCP, Urine today:+1 RBCs, micro will be sent   reports that she has never smoked. She has never used smokeless tobacco. She reports that she drinks alcohol. She reports that she does not use illicit drugs.  Past Medical History  Diagnosis Date  . Hx of varicella   . GERD (gastroesophageal reflux disease)   . Diverticulitis   . Hypertension   . 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 Cesarean section      2  . Cholecystectomy  2005  . Tubal ligation      BTL    Current Outpatient Prescriptions  Medication Sig Dispense Refill  . benazepril-hydrochlorthiazide (LOTENSIN HCT) 20-12.5 MG per tablet TAKE 1 TABLET BY MOUTH DAILY. 90 tablet 0  . clobetasol ointment (TEMOVATE) 0.05 % Apply topically as needed. 30 g 1  . Cyanocobalamin (VITAMIN B 12 PO) Take by mouth.    . IRON PO Take by mouth.    . ranitidine (ZANTAC) 75 MG tablet Take 75 mg by mouth at bedtime.     No current facility-administered medications for this visit.    Family History  Problem Relation Age of Onset  . Hyperlipidemia Mother   . Hypertension Mother   . Hypertension Father   . Hypertension Sister   . Stroke Sister   . Arthritis Sister   . COPD Mother     Deceased  . Cancer - Other Father     Liver deceased  . Obstructive Sleep Apnea Sister     And secondary atrial fibrillation.     ROS:  Pertinent items are noted in HPI.  Otherwise, a comprehensive ROS was negative.  Exam:   BP 138/68 mmHg  Pulse 60  Resp 16  Ht 5\' 3"  (1.6 m)  Wt 215 lb (97.523 kg)  BMI 38.09 kg/m2  LMP 06/08/2004  Height: 5\' 3"  (160 cm)  Ht Readings from Last 3 Encounters:  10/19/14 5\' 3"  (1.6 m)  10/05/14 5' 2.75" (1.594 m)  08/10/14 5' 2.75" (1.594 m)    General appearance: alert, cooperative and appears stated age Head: Normocephalic, without obvious abnormality, atraumatic Neck: no adenopathy, supple, symmetrical, trachea midline and thyroid normal to inspection and palpation Lungs: clear to auscultation bilaterally Breasts: normal appearance, no masses or tenderness Heart: regular rate and rhythm Abdomen: soft, non-tender; bowel sounds normal; no masses,  no organomegaly Extremities: extremities normal, atraumatic, no cyanosis or edema Skin: Skin color, texture, turgor normal. No rashes or lesions Lymph nodes: Cervical, supraclavicular, and axillary nodes normal. No abnormal inguinal nodes palpated Neurologic: Grossly normal   Pelvic: External genitalia:  no lesions              Urethra:  normal appearing urethra with no masses, tenderness or lesions  Bartholins and Skenes: normal                 Vagina: normal appearing vagina with normal color and discharge, no lesions              Cervix: no lesions              Pap taken: Yes.   Bimanual Exam:  Uterus:  normal size, contour, position, consistency, mobility, non-tender              Adnexa: normal adnexa and no mass, fullness, tenderness               Rectovaginal: Confirms               Anus:  normal sphincter tone, no lesions  Chaperone was present for exam.  A:  Well Woman with normal exam PMP, no HRT H/O LS&A Hypertension H/O microscopic hematuria noted on dip U/A with Dr. Regis Bill and here today  P: Mammogram yearly Declines referral for colonoscopy this year.  IFOB given today.   Instructions provided. pap smear only today. ASCUS with neg HR HPV 2012. Labs with Dr. Regis Bill Rx for clobetasol not needed.  She will call if/when needs Rx. return annually or prn

## 2014-10-20 LAB — URINALYSIS, MICROSCOPIC ONLY
Bacteria, UA: NONE SEEN
CASTS: NONE SEEN
Crystals: NONE SEEN
SQUAMOUS EPITHELIAL / LPF: NONE SEEN

## 2014-10-22 LAB — IPS PAP TEST WITH REFLEX TO HPV

## 2014-11-28 ENCOUNTER — Other Ambulatory Visit: Payer: Self-pay | Admitting: Internal Medicine

## 2014-11-28 NOTE — Telephone Encounter (Signed)
Sent to the pharmacy by e-scribe. 

## 2014-12-25 ENCOUNTER — Encounter: Payer: Self-pay | Admitting: Obstetrics & Gynecology

## 2015-09-13 ENCOUNTER — Other Ambulatory Visit: Payer: Self-pay | Admitting: Internal Medicine

## 2015-09-13 NOTE — Telephone Encounter (Signed)
Sent to the pharmacy by e-scribe.  Pt has cpx scheduled for 12/27/15

## 2015-10-15 ENCOUNTER — Other Ambulatory Visit: Payer: Self-pay

## 2015-10-15 DIAGNOSIS — Z1231 Encounter for screening mammogram for malignant neoplasm of breast: Secondary | ICD-10-CM

## 2015-10-29 ENCOUNTER — Ambulatory Visit
Admission: RE | Admit: 2015-10-29 | Discharge: 2015-10-29 | Disposition: A | Discharge status: Home / Self Care | Payer: BLUE CROSS/BLUE SHIELD | Source: Ambulatory Visit

## 2015-10-29 DIAGNOSIS — Z1231 Encounter for screening mammogram for malignant neoplasm of breast: Secondary | ICD-10-CM

## 2015-12-27 ENCOUNTER — Telehealth: Payer: Self-pay | Admitting: Obstetrics & Gynecology

## 2015-12-27 NOTE — Telephone Encounter (Signed)
Left patient a message to call back to reschedule a future appointment that was cancelled by the provider. °

## 2015-12-31 ENCOUNTER — Other Ambulatory Visit (INDEPENDENT_AMBULATORY_CARE_PROVIDER_SITE_OTHER): Payer: BLUE CROSS/BLUE SHIELD

## 2015-12-31 DIAGNOSIS — Z0000 Encounter for general adult medical examination without abnormal findings: Secondary | ICD-10-CM | POA: Diagnosis not present

## 2015-12-31 LAB — CBC WITH DIFFERENTIAL/PLATELET
BASOS ABS: 0 10*3/uL (ref 0.0–0.1)
Basophils Relative: 0.3 % (ref 0.0–3.0)
EOS ABS: 0.3 10*3/uL (ref 0.0–0.7)
Eosinophils Relative: 3.7 % (ref 0.0–5.0)
HEMATOCRIT: 38.8 % (ref 36.0–46.0)
HEMOGLOBIN: 13.1 g/dL (ref 12.0–15.0)
LYMPHS PCT: 27.7 % (ref 12.0–46.0)
Lymphs Abs: 2.2 10*3/uL (ref 0.7–4.0)
MCHC: 33.8 g/dL (ref 30.0–36.0)
MCV: 85.9 fl (ref 78.0–100.0)
MONO ABS: 0.4 10*3/uL (ref 0.1–1.0)
Monocytes Relative: 5 % (ref 3.0–12.0)
Neutro Abs: 5.1 10*3/uL (ref 1.4–7.7)
Neutrophils Relative %: 63.3 % (ref 43.0–77.0)
PLATELETS: 322 10*3/uL (ref 150.0–400.0)
RBC: 4.51 Mil/uL (ref 3.87–5.11)
RDW: 13.1 % (ref 11.5–15.5)
WBC: 8.1 10*3/uL (ref 4.0–10.5)

## 2015-12-31 LAB — BASIC METABOLIC PANEL
BUN: 19 mg/dL (ref 6–23)
CALCIUM: 9.4 mg/dL (ref 8.4–10.5)
CO2: 32 mEq/L (ref 19–32)
CREATININE: 0.81 mg/dL (ref 0.40–1.20)
Chloride: 99 mEq/L (ref 96–112)
GFR: 75.63 mL/min (ref >60.00)
Glucose, Bld: 108 mg/dL — ABNORMAL HIGH (ref 70–99)
Potassium: 3.5 mEq/L (ref 3.5–5.1)
Sodium: 138 mEq/L (ref 135–145)

## 2015-12-31 LAB — LIPID PANEL
CHOL/HDL RATIO: 3
Cholesterol: 214 mg/dL — ABNORMAL HIGH (ref 0–200)
HDL: 67.5 mg/dL (ref >39.00)
LDL CALC: 128 mg/dL — AB (ref 0–99)
NONHDL: 146.84
Triglycerides: 92 mg/dL (ref 0.0–149.0)
VLDL: 18.4 mg/dL (ref 0.0–40.0)

## 2015-12-31 LAB — HEPATIC FUNCTION PANEL
ALK PHOS: 78 U/L (ref 39–117)
ALT: 17 U/L (ref 0–35)
AST: 17 U/L (ref 0–37)
Albumin: 4.2 g/dL (ref 3.5–5.2)
BILIRUBIN DIRECT: 0.1 mg/dL (ref 0.0–0.3)
BILIRUBIN TOTAL: 0.5 mg/dL (ref 0.2–1.2)
TOTAL PROTEIN: 7.2 g/dL (ref 6.0–8.3)

## 2015-12-31 LAB — TSH: TSH: 1.73 u[IU]/mL (ref 0.35–4.50)

## 2016-01-01 ENCOUNTER — Other Ambulatory Visit: Payer: Self-pay

## 2016-01-03 ENCOUNTER — Ambulatory Visit: Payer: 59 | Admitting: Obstetrics & Gynecology

## 2016-01-06 ENCOUNTER — Encounter: Payer: Self-pay | Admitting: Internal Medicine

## 2016-01-14 NOTE — Progress Notes (Signed)
Pre visit review using our clinic review tool, if applicable. No additional management support is needed unless otherwise documented below in the visit note.  Chief Complaint  Patient presents with  . Annual Exam    HPI: Patient  [name]  65 y.o. comes in today for Preventive Health Care visit  bp good  Has high deductible so limiting some HCM issues  Feels well. 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 OPHTHALMOLOGY EXAM  11/06/1961  . HEMOGLOBIN A1C  03/30/2015  . FOOT EXAM  10/05/2015  . INFLUENZA VACCINE  01/07/2016  . COLONOSCOPY  01/14/2017 (Originally 06/08/2013)  . ZOSTAVAX  01/14/2017 (Originally 11/07/2011)  . Hepatitis C Screening  01/14/2017 (Originally 1950/08/04)  . HIV Screening  01/14/2017 (Originally 11/07/1966)  . TETANUS/TDAP  06/08/2017  . PAP SMEAR  10/18/2017  . MAMMOGRAM  10/28/2017   Health Maintenance Review LIFESTYLE:  Exercise:   Averages   On feet all day at work   7- 10 k  Tobacco/ETS no: Alcohol:    Very littlke Sugar beverages: switched to water ocass sweets.  Sleep:   About 6- 7 hours  Drug use: no hh of 2     ROS:  GEN/ HEENT: No fever, significant weight changes sweats headaches vision problems hearing changes, CV/ PULM; No chest pain shortness of breath cough, syncope,edema  change in exercise tolerance. GI /GU: No adominal pain, vomiting, change in bowel habits. No blood in the stool. No significant GU symptoms. SKIN/HEME: ,no acute skin rashes suspicious lesions or bleeding. No lymphadenopathy, nodules, masses.  NEURO/ PSYCH:  No neurologic signs such as weakness numbness. No depression anxiety. IMM/ Allergy: No unusual infections.  Allergy .   REST of 12 system review negative except as per HPI   Past Medical History:  Diagnosis Date  . Diverticulitis   . GERD (gastroesophageal reflux disease)   . 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 Hx of varicella   . Hypertension     Past Surgical History:  Procedure Laterality Date  . CESAREAN SECTION     2  . CHOLECYSTECTOMY  2005  . TUBAL LIGATION     BTL    Family History  Problem Relation Age of Onset  . Hyperlipidemia Mother   . Hypertension Mother   . Hypertension Father   . Hypertension Sister   . Stroke Sister   . Arthritis Sister   . COPD Mother     Deceased  . Cancer - Other Father     Liver deceased  . Obstructive Sleep Apnea Sister     And secondary atrial fibrillation.    Social History   Social History  . Marital status: Married    Spouse name: N/A  . Number of children: N/A  . Years of education: N/A   Social History Main Topics  . Smoking status: Never Smoker  . Smokeless tobacco: Never Used  . Alcohol use 0.0 oz/week     Comment: seldom  . Drug use: No  . Sexual activity: Not Currently    Partners: Male    Birth control/ protection: Other-see comments     Comment: husband had prostate cancer and BTL   Other Topics Concern  . 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 ranitidine (ZANTAC) 75 MG tablet Take 75 mg by mouth at bedtime.    . benazepril-hydrochlorthiazide (LOTENSIN HCT) 20-12.5 MG tablet TAKE 1 TABLET BY MOUTH DAILY. 90 tablet 1  . clobetasol ointment (TEMOVATE) 0.05 % Apply topically as needed. (Patient not taking: Reported on 01/15/2016) 30 g 1  . Cyanocobalamin (VITAMIN B 12 PO) Take by mouth.    . IRON PO Take by mouth.     No facility-administered medications prior to visit.      EXAM:  BP 120/60 (BP Location: Right Arm, Patient Position: Sitting, Cuff Size: Large)   Temp 98.2 F (36.8 C) (Oral)   Ht 5' 2.75" (1.594 m)    Wt 215 lb 12.8 oz (97.9 kg)   LMP 06/08/2004   BMI 38.53 kg/m   Body mass index is 38.53 kg/m.  Physical Exam: Vital signs reviewed [id number] is a well-developed well-nourished alert cooperative    who appearsr stated age in no acute distress.  HEENT: normocephalic atraumatic , Eyes: PERRL EOM's full, conjunctiva clear, Nares: paten,t no deformity discharge or tenderness., Ears: no deformity EAC's clear TMs with normal landmarks. Mouth: clear OP, no lesions, edema.  Moist mucous membranes. Dentition in adequate repair. NECK: supple without masses, thyromegaly or bruits. CHEST/PULM:  Clear to auscultation and percussion breath sounds equal no wheeze , rales or rhonchi. No chest wall deformities or tenderness.Breast: normal by inspection . No dimpling, discharge, masses, tenderness or discharge . Abdomen:  Sof,t normal bowel sounds without hepatosplenomegaly, no guarding rebound or masses no CVA tenderness CV: PMI is nondisplaced, S1 S2 no gallops, murmurs, rubs. Peripheral pulses are full without delay.No JVD .  ABDOMEN: Bowel sounds normal nontender  No guard or rebound, no hepato splenomegal no CVA tenderness.  No hernia. Extremtities:  No clubbing cyanosis or edema, no acute joint swelling or redness no focal atrophy  Varicose veins no ulcerations or sig edema NEURO:  Oriented x3, cranial nerves 3-12 appear to be intact, no obvious focal weakness,gait within normal limits no abnormal reflexes or asymmetrical SKIN: No acute rashes normal turgor, color, no bruising or petechiae.  Tick bit local reaction x 3 left abd  PSYCH: Oriented, good eye contact, no obvious depression anxiety, cognition and judgment appear normal. LN: no cervical axillary inguinal adenopathy  Lab Results  Component Value Date   WBC 8.1 12/31/2015   HGB 13.1 12/31/2015   HCT 38.8 12/31/2015   PLT 322.0 12/31/2015   GLUCOSE 108 (H) 12/31/2015   CHOL 214 (H) 12/31/2015   TRIG 92.0 12/31/2015   HDL 67.50 12/31/2015    LDLCALC 128 (H) 12/31/2015   ALT 17 12/31/2015   AST 17 12/31/2015   NA 138 12/31/2015   K 3.5 12/31/2015   CL 99 12/31/2015   CREATININE 0.81 12/31/2015   BUN 19 12/31/2015   CO2 32 12/31/2015   TSH 1.73 12/31/2015   HGBA1C 5.8 09/28/2014   MICROALBUR 0.2 03/23/2014   Wt Readings from Last 3 Encounters:  01/15/16 215 lb 12.8 oz (97.9 kg)  10/19/14 215 lb (97.5 kg)  10/05/14 215 lb 1.6 oz (97.6 kg)   BP Readings from Last 3 Encounters:  01/15/16 120/60  10/19/14 138/68  10/05/14 120/70     ASSESSMENT AND PLAN:  Discussed the following assessment and plan:  Visit for preventive health examination  Essential hypertension - has been controlled same meds   Fasting hyperglycemia - stable  lsi can check a1c in  future visits if appropriate Disc LSI  And avoidance of getting DM Update HCM when she is on medicare ( Financial cost sharing  7000 deductable) Patient Care Team: Burnis Medin, MD as PCP - General (Internal Medicine) Juanita Craver, MD as Attending Physician (Gastroenterology) Jola Schmidt, MD as Consulting Physician (Ophthalmology) Megan Salon, MD as Consulting Physician (Gynecology) Patient Instructions  Continue lifestyle intervention healthy eating and exercise .  Cut out sugar drinks     To avoid getting diabetes .    Healthy lifestyle includes : At least 150 minutes of exercise weeks  , weight at healthy levels, which is usually   BMI 19-25. Avoid trans fats and processed foods;  Increase fresh fruits and veges to 5 servings per day. And avoid sweet beverages including tea and juice. Mediterranean diet with olive oil and nuts have been noted to be heart and brain healthy . Avoid tobacco products . Limit  alcohol to  7 per week for women and 14 servings for men.  Get adequate sleep . Wear seat belts . Don't text and drive .     Standley Brooking. [place] M.D.

## 2016-01-15 ENCOUNTER — Ambulatory Visit (INDEPENDENT_AMBULATORY_CARE_PROVIDER_SITE_OTHER): Payer: BLUE CROSS/BLUE SHIELD | Admitting: Internal Medicine

## 2016-01-15 ENCOUNTER — Encounter: Payer: Self-pay | Admitting: Internal Medicine

## 2016-01-15 VITALS — BP 120/60 | Temp 98.2°F | Ht 62.75 in | Wt 215.8 lb

## 2016-01-15 DIAGNOSIS — I10 Essential (primary) hypertension: Secondary | ICD-10-CM | POA: Diagnosis not present

## 2016-01-15 DIAGNOSIS — Z0000 Encounter for general adult medical examination without abnormal findings: Secondary | ICD-10-CM

## 2016-01-15 DIAGNOSIS — R7301 Impaired fasting glucose: Secondary | ICD-10-CM

## 2016-01-15 MED ORDER — BENAZEPRIL-HYDROCHLOROTHIAZIDE 20-12.5 MG PO TABS: 1.0000 | ORAL_TABLET | Freq: Every day | ORAL | 3 refills | Status: DC

## 2016-01-15 NOTE — Patient Instructions (Signed)
Continue lifestyle intervention healthy eating and exercise .  Cut out sugar drinks     To avoid getting diabetes .    Healthy lifestyle includes : At least 150 minutes of exercise weeks  , weight at healthy levels, which is usually   BMI 19-25. Avoid trans fats and processed foods;  Increase fresh fruits and veges to 5 servings per day. And avoid sweet beverages including tea and juice. Mediterranean diet with olive oil and nuts have been noted to be heart and brain healthy . Avoid tobacco products . Limit  alcohol to  7 per week for women and 14 servings for men.  Get adequate sleep . Wear seat belts . Don't text and drive .

## 2016-02-18 ENCOUNTER — Ambulatory Visit: Payer: Self-pay | Admitting: Obstetrics & Gynecology

## 2016-10-07 DIAGNOSIS — D225 Melanocytic nevi of trunk: Secondary | ICD-10-CM | POA: Diagnosis not present

## 2016-10-07 DIAGNOSIS — L814 Other melanin hyperpigmentation: Secondary | ICD-10-CM | POA: Diagnosis not present

## 2016-10-07 DIAGNOSIS — L821 Other seborrheic keratosis: Secondary | ICD-10-CM | POA: Diagnosis not present

## 2016-10-07 DIAGNOSIS — D1800 Hemangioma unspecified site: Secondary | ICD-10-CM | POA: Diagnosis not present

## 2016-10-13 DIAGNOSIS — K219 Gastro-esophageal reflux disease without esophagitis: Secondary | ICD-10-CM | POA: Diagnosis not present

## 2016-10-13 DIAGNOSIS — K5901 Slow transit constipation: Secondary | ICD-10-CM | POA: Diagnosis not present

## 2016-10-13 DIAGNOSIS — Z1211 Encounter for screening for malignant neoplasm of colon: Secondary | ICD-10-CM | POA: Diagnosis not present

## 2016-10-13 DIAGNOSIS — K5730 Diverticulosis of large intestine without perforation or abscess without bleeding: Secondary | ICD-10-CM | POA: Diagnosis not present

## 2016-11-16 DIAGNOSIS — Z1211 Encounter for screening for malignant neoplasm of colon: Secondary | ICD-10-CM | POA: Diagnosis not present

## 2016-11-16 DIAGNOSIS — K5730 Diverticulosis of large intestine without perforation or abscess without bleeding: Secondary | ICD-10-CM | POA: Diagnosis not present

## 2016-11-16 LAB — HM COLONOSCOPY

## 2016-11-24 DIAGNOSIS — R69 Illness, unspecified: Secondary | ICD-10-CM | POA: Diagnosis not present

## 2016-11-25 ENCOUNTER — Encounter: Payer: Self-pay | Admitting: Family Medicine

## 2016-11-30 DIAGNOSIS — H04561 Stenosis of right lacrimal punctum: Secondary | ICD-10-CM | POA: Diagnosis not present

## 2016-12-10 ENCOUNTER — Other Ambulatory Visit: Payer: Self-pay | Admitting: Internal Medicine

## 2016-12-10 DIAGNOSIS — Z1231 Encounter for screening mammogram for malignant neoplasm of breast: Secondary | ICD-10-CM

## 2016-12-23 DIAGNOSIS — R69 Illness, unspecified: Secondary | ICD-10-CM | POA: Diagnosis not present

## 2017-01-11 ENCOUNTER — Ambulatory Visit
Admission: RE | Admit: 2017-01-11 | Discharge: 2017-01-11 | Disposition: A | Discharge status: Home / Self Care | Payer: Medicare HMO | Source: Ambulatory Visit | Attending: Internal Medicine | Admitting: Internal Medicine

## 2017-01-11 DIAGNOSIS — Z1231 Encounter for screening mammogram for malignant neoplasm of breast: Secondary | ICD-10-CM

## 2017-01-13 ENCOUNTER — Other Ambulatory Visit: Payer: BLUE CROSS/BLUE SHIELD

## 2017-01-18 ENCOUNTER — Encounter: Payer: BLUE CROSS/BLUE SHIELD | Admitting: Internal Medicine

## 2017-01-18 DIAGNOSIS — H11431 Conjunctival hyperemia, right eye: Secondary | ICD-10-CM | POA: Diagnosis not present

## 2017-01-18 DIAGNOSIS — H04551 Acquired stenosis of right nasolacrimal duct: Secondary | ICD-10-CM | POA: Diagnosis not present

## 2017-01-18 DIAGNOSIS — H04411 Chronic dacryocystitis of right lacrimal passage: Secondary | ICD-10-CM | POA: Diagnosis not present

## 2017-01-18 DIAGNOSIS — H04223 Epiphora due to insufficient drainage, bilateral lacrimal glands: Secondary | ICD-10-CM | POA: Diagnosis not present

## 2017-01-18 DIAGNOSIS — H04552 Acquired stenosis of left nasolacrimal duct: Secondary | ICD-10-CM | POA: Diagnosis not present

## 2017-01-18 DIAGNOSIS — H04222 Epiphora due to insufficient drainage, left lacrimal gland: Secondary | ICD-10-CM | POA: Diagnosis not present

## 2017-01-18 DIAGNOSIS — H04412 Chronic dacryocystitis of left lacrimal passage: Secondary | ICD-10-CM | POA: Diagnosis not present

## 2017-01-18 DIAGNOSIS — H04221 Epiphora due to insufficient drainage, right lacrimal gland: Secondary | ICD-10-CM | POA: Diagnosis not present

## 2017-01-19 ENCOUNTER — Other Ambulatory Visit: Payer: Self-pay | Admitting: Internal Medicine

## 2017-01-19 ENCOUNTER — Other Ambulatory Visit: Payer: BLUE CROSS/BLUE SHIELD

## 2017-01-24 NOTE — Progress Notes (Signed)
Chief Complaint  Patient presents with  . Annual Exam    HPI: [name] 66 y.o. comes in today for Preventive Welcome to [facility] exam/ visit . And Chronic disease management Since last visit.  bp good   Omeprazole per dr Collene Mares.   Had colon  10 year  Recall  Health Maintenance  Topic Date Due  . HEMOGLOBIN A1C  03/30/2015  . FOOT EXAM  10/05/2015  . PNA vac Low Risk Adult (1 of 2 - PCV13) 11/06/2016  . INFLUENZA VACCINE  01/06/2017  . Hepatitis C Screening  01/26/2018 (Originally [birth year])  . HIV Screening  01/26/2018 (Originally 11/07/1965)  . TETANUS/TDAP  06/08/2017  . OPHTHALMOLOGY EXAM  07/29/2017  . PAP SMEAR  10/18/2017  . MAMMOGRAM  01/12/2019  . COLONOSCOPY  11/17/2026  . DEXA SCAN  Completed   Health Maintenance Review LIFESTYLE:  Exercise:   walka a lot a work  Tobacco/ETS: no Alcohol:  little Sugar beverages: has cut back  Sleep:7 hours  Drug use: no HH: 2 1 dog  Work 30 hours    Hearing: ok  Vision:  No limitations at present . Last eye check UTD  Safety:  Has smoke detector and wears seat belts.   firearms safe. No excess sun exposure. Sees dentist regularly.  Falls: no  Advance directive :  Reviewed .  Memory: Felt to be good  , no concern from her or her family.  Depression: No anhedonia unusual crying or depressive symptoms  Nutrition: Eats well balanced diet; adequate calcium and vitamin D. No swallowing chewing problems.  Injury: no major injuries in the last six months.  Other healthcare providers:  Reviewed today .  Social:  Lives with spouse married.    Preventive parameters: up-to-date  Reviewed   ADLS:   There are no problems or need for assistance  driving, feeding, obtaining food, dressing, toileting and bathing, managing money using phone. She is independent.     ROS:  GEN/ HEENT: No fever, significant weight changes sweats headaches vision problems hearing changes, CV/ PULM; No chest pain shortness of breath  cough, syncope,edema  change in exercise tolerance. GI /GU: No adominal pain, vomiting, change in bowel habits. No blood in the stool. No significant GU symptoms. SKIN/HEME: ,no acute skin rashes suspicious lesions or bleeding. No lymphadenopathy, nodules, masses.  NEURO/ PSYCH:  No neurologic signs such as weakness numbness. No depression anxiety. IMM/ Allergy: No unusual infections.  Allergy .   REST of 12 system review negative except as per HPI   Past Medical History:  Diagnosis Date  . Diverticulitis   . GERD (gastroesophageal reflux disease)   . 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 Hx of varicella   . Hypertension     Family History  Problem Relation Age of Onset  . Hyperlipidemia Mother   . Hypertension Mother   . COPD Mother        Deceased  . Hypertension Father   . Cancer - Other Father        Liver deceased  . Hypertension Sister   . Stroke Sister   . Arthritis Sister   . Obstructive Sleep Apnea Sister        And secondary atrial fibrillation.    Social History   Social History  . Marital status: Married    Spouse name: N/A  . Number of children: N/A  . Years of education: N/A  Social History Main Topics  . Smoking status: Never Smoker  . Smokeless tobacco: Never Used  . Alcohol use 0.0 oz/week     Comment: seldom  . Drug use: No  . Sexual activity: Not Currently    Partners: Male    Birth control/ protection: Other-see comments     Comment: husband had prostate cancer and BTL   Other Topics Concern  . 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 benazepril-hydrochlorthiazide (LOTENSIN HCT) 20-12.5 MG tablet TAKE 1  TABLET BY MOUTH DAILY.  Marland Kitchen omeprazole (PRILOSEC) 40 MG capsule TAKE 1 CAPSULE 20 MINUTES BEFORE BREAKFAST DAILY  . [DISCONTINUED] clobetasol ointment (TEMOVATE) 0.05 % Apply topically as needed. (Patient not taking: Reported on 01/15/2016)  . [DISCONTINUED] ranitidine (ZANTAC) 75 MG tablet Take 75 mg by mouth at bedtime.   No facility-administered encounter medications on file as of 01/26/2017.     EXAM:  BP 124/70 (BP Location: Right Arm, Patient Position: Sitting, Cuff Size: Normal)   Pulse 62   Temp 97.6 F (36.4 C) (Oral)   Ht 5' 2.5" (1.588 m)   Wt 215 lb 9.6 oz (97.8 kg)   LMP 06/08/2004   BMI 38.81 kg/m   Body mass index is 38.81 kg/m.  Physical Exam: Vital signs reviewed DQQ:IWLN is a well-developed well-nourished alert cooperative   who appears stated age in no acute distress.  HEENT: normocephalic atraumatic , Eyes: PERRL EOM's full, conjunctiva clear, Nares: paten,t no deformity discharge or tenderness., Ears: no deformity EAC's clear TMs with normal landmarks. Mouth: clear OP, no lesions, edema.  Moist mucous membranes. Dentition in adequate repair. NECK: supple without masses, thyromegaly or bruits. CHEST/PULM:  Clear to auscultation and percussion breath sounds equal no wheeze , rales or rhonchi. No chest wall deformities or tenderness. CV: PMI is nondisplaced, S1 S2 no gallops, murmurs, rubs. Peripheral pulses are full without delay.No JVD .  ABDOMEN: Bowel sounds normal nontender  No guard or rebound, no hepato splenomegal no CVA tenderness.   Extremtities:  No clubbing cyanosis or edema, no acute joint swelling or redness no focal atrophy NEURO:  Oriented x3, cranial nerves 3-12 appear to be intact, no obvious focal weakness,gait within normal limits no abnormal reflexes or asymmetrical SKIN: No acute rashes normal turgor, color, no bruising or petechiae. PSYCH: Oriented, good eye contact, no obvious depression anxiety, cognition and judgment appear normal. LN: no  cervical axillary inguinal adenopathy No noted deficits in memory, attention, and speech. Lab Results  Component Value Date   WBC 7.3 01/26/2017   HGB 12.6 01/26/2017   HCT 37.2 01/26/2017   PLT 298.0 01/26/2017   GLUCOSE 114 (H) 01/26/2017   CHOL 180 01/26/2017   TRIG 97.0 01/26/2017   HDL 59.10 01/26/2017   LDLCALC 101 (H) 01/26/2017   ALT 18 01/26/2017   AST 16 01/26/2017   NA 141 01/26/2017   K 4.8 01/26/2017   CL 102 01/26/2017   CREATININE 0.85 01/26/2017   BUN 17 01/26/2017   CO2 34 (H) 01/26/2017   TSH 2.12 01/26/2017   HGBA1C 6.1 01/26/2017   MICROALBUR 0.2 03/23/2014     EKG NSR  nl intervals   ASSESSMENT AND PLAN:  Discussed the following assessment and plan:  Welcome to Medicare preventive visit  Visit for preventive health examination - Plan: Basic metabolic panel, CBC with Differential/Platelet, Hemoglobin A1c, Hepatic function panel, Lipid panel, TSH  Essential hypertension - Plan: Basic metabolic panel, CBC with Differential/Platelet, Hemoglobin A1c, Hepatic function panel, Lipid panel, TSH, EKG 12-Lead  Fasting hyperglycemia - Plan: Basic metabolic panel, CBC with Differential/Platelet, Hemoglobin A1c, Hepatic function panel, Lipid panel, TSH  Medication management - Plan: Basic metabolic panel, CBC with Differential/Platelet, Hemoglobin A1c, Hepatic function panel, Lipid panel, TSH  BMI 38.0-38.9,adult - Plan: Basic metabolic panel, CBC with Differential/Platelet, Hemoglobin A1c, Hepatic function panel, Lipid panel, TSH  Hyperlipidemia, unspecified hyperlipidemia type - Plan: Basic metabolic panel, CBC with Differential/Platelet, Hemoglobin A1c, Hepatic function panel, Lipid panel, TSH  Need for prophylactic vaccination against Streptococcus pneumoniae (pneumococcus) - Plan: Pneumococcal conjugate vaccine 13-valent IM prevnar  shingrix disc  hld Patient Care Team: Breda Bond, Standley Brooking, MD as PCP - General (Internal Medicine) Juanita Craver, MD as  Attending Physician (Gastroenterology) Jola Schmidt, MD as Consulting Physician (Ophthalmology) Megan Salon, MD as Consulting Physician (Gynecology)  Patient Instructions  Continue attention lifestyle intervention healthy eating and exercise . To avoid diabetes and heart disease.   Will notify you  of labs when available.  Call for order dexa scan for next year.  Make sure adequate vit d 800 - 1000 iu per day.  If all ok then yearly check up and labs  Shingrix is new shingles vaccine  Get at pharmacy .  Your EKG is normal today     Preventive Care 65 Years and Older, Female Preventive care refers to lifestyle choices and visits with your health care provider that can promote health and wellness. What does preventive care include?  A yearly physical exam. This is also called an annual well check.  Dental exams once or twice a year.  Routine eye exams. Ask your health care provider how often you should have your eyes checked.  Personal lifestyle choices, including: ? Daily care of your teeth and gums. ? Regular physical activity. ? Eating a healthy diet. ? Avoiding tobacco and drug use. ? Limiting alcohol use. ? Practicing safe sex. ? Taking low-dose aspirin every day. ? Taking vitamin and mineral supplements as recommended by your health care provider. What happens during an annual well check? The services and screenings done by your health care provider during your annual well check will depend on your age, overall health, lifestyle risk factors, and family history of disease. Counseling Your health care provider may ask you questions about your:  Alcohol use.  Tobacco use.  Drug use.  Emotional well-being.  Home and relationship well-being.  Sexual activity.  Eating habits.  History of falls.  Memory and ability to understand (cognition).  Work and work Statistician.  Reproductive health.  Screening You may have the following tests or  measurements:  Height, weight, and BMI.  Blood pressure.  Lipid and cholesterol levels. These may be checked every 5 years, or more frequently if you are over 60 years old.  Skin check.  Lung cancer screening. You may have this screening every year starting at age 49 if you have a 30-pack-year history of smoking and currently smoke or have quit within the past 15 years.  Fecal occult blood test (FOBT) of the stool. You may have this test every year starting at age 37.  Flexible sigmoidoscopy or colonoscopy. You may have  a sigmoidoscopy every 5 years or a colonoscopy every 10 years starting at age 54.  Hepatitis C blood test.  Hepatitis B blood test.  Sexually transmitted disease (STD) testing.  Diabetes screening. This is done by checking your blood sugar (glucose) after you have not eaten for a while (fasting). You may have this done every 1-3 years.  Bone density scan. This is done to screen for osteoporosis. You may have this done starting at age 6.  Mammogram. This may be done every 1-2 years. Talk to your health care provider about how often you should have regular mammograms.  Talk with your health care provider about your test results, treatment options, and if necessary, the need for more tests. Vaccines Your health care provider may recommend certain vaccines, such as:  Influenza vaccine. This is recommended every year.  Tetanus, diphtheria, and acellular pertussis (Tdap, Td) vaccine. You may need a Td booster every 10 years.  Varicella vaccine. You may need this if you have not been vaccinated.  Zoster vaccine. You may need this after age 73.  Measles, mumps, and rubella (MMR) vaccine. You may need at least one dose of MMR if you were born in 1957 or later. You may also need a second dose.  Pneumococcal 13-valent conjugate (PCV13) vaccine. One dose is recommended after age 5.  Pneumococcal polysaccharide (PPSV23) vaccine. One dose is recommended after age  7.  Meningococcal vaccine. You may need this if you have certain conditions.  Hepatitis A vaccine. You may need this if you have certain conditions or if you travel or work in places where you may be exposed to hepatitis A.  Hepatitis B vaccine. You may need this if you have certain conditions or if you travel or work in places where you may be exposed to hepatitis B.  Haemophilus influenzae type b (Hib) vaccine. You may need this if you have certain conditions.  Talk to your health care provider about which screenings and vaccines you need and how often you need them. This information is not intended to replace advice given to you by your health care provider. Make sure you discuss any questions you have with your health care provider. Document Released: 06/21/2015 Document Revised: 02/12/2016 Document Reviewed: 03/26/2015 Elsevier Interactive Patient Education  2017 [employer]. Emmette Katt M.D.

## 2017-01-26 ENCOUNTER — Ambulatory Visit (INDEPENDENT_AMBULATORY_CARE_PROVIDER_SITE_OTHER): Payer: Medicare HMO | Admitting: Internal Medicine

## 2017-01-26 ENCOUNTER — Encounter: Payer: Self-pay | Admitting: Internal Medicine

## 2017-01-26 VITALS — BP 124/70 | HR 62 | Temp 97.6°F | Ht 62.5 in | Wt 215.6 lb

## 2017-01-26 DIAGNOSIS — Z79899 Other long term (current) drug therapy: Secondary | ICD-10-CM | POA: Diagnosis not present

## 2017-01-26 DIAGNOSIS — I10 Essential (primary) hypertension: Secondary | ICD-10-CM | POA: Diagnosis not present

## 2017-01-26 DIAGNOSIS — E785 Hyperlipidemia, unspecified: Secondary | ICD-10-CM

## 2017-01-26 DIAGNOSIS — R7301 Impaired fasting glucose: Secondary | ICD-10-CM

## 2017-01-26 DIAGNOSIS — Z23 Encounter for immunization: Secondary | ICD-10-CM | POA: Diagnosis not present

## 2017-01-26 DIAGNOSIS — Z6838 Body mass index (BMI) 38.0-38.9, adult: Secondary | ICD-10-CM | POA: Diagnosis not present

## 2017-01-26 DIAGNOSIS — Z0000 Encounter for general adult medical examination without abnormal findings: Secondary | ICD-10-CM | POA: Diagnosis not present

## 2017-01-26 LAB — BASIC METABOLIC PANEL
BUN: 17 mg/dL (ref 6–23)
CALCIUM: 9.2 mg/dL (ref 8.4–10.5)
CO2: 34 mEq/L — ABNORMAL HIGH (ref 19–32)
Chloride: 102 mEq/L (ref 96–112)
Creatinine, Ser: 0.85 mg/dL (ref 0.40–1.20)
GFR: 71.29 mL/min (ref >60.00)
GLUCOSE: 114 mg/dL — AB (ref 70–99)
Potassium: 4.8 mEq/L (ref 3.5–5.1)
SODIUM: 141 meq/L (ref 135–145)

## 2017-01-26 LAB — CBC WITH DIFFERENTIAL/PLATELET
BASOS ABS: 0 10*3/uL (ref 0.0–0.1)
Basophils Relative: 0.6 % (ref 0.0–3.0)
Eosinophils Absolute: 0.3 10*3/uL (ref 0.0–0.7)
Eosinophils Relative: 3.9 % (ref 0.0–5.0)
HCT: 37.2 % (ref 36.0–46.0)
Hemoglobin: 12.6 g/dL (ref 12.0–15.0)
LYMPHS ABS: 1.6 10*3/uL (ref 0.7–4.0)
Lymphocytes Relative: 22.3 % (ref 12.0–46.0)
MCHC: 33.9 g/dL (ref 30.0–36.0)
MCV: 87.9 fl (ref 78.0–100.0)
Monocytes Absolute: 0.4 10*3/uL (ref 0.1–1.0)
Monocytes Relative: 6.1 % (ref 3.0–12.0)
NEUTROS PCT: 67.1 % (ref 43.0–77.0)
Neutro Abs: 4.9 10*3/uL (ref 1.4–7.7)
Platelets: 298 10*3/uL (ref 150.0–400.0)
RBC: 4.23 Mil/uL (ref 3.87–5.11)
RDW: 13 % (ref 11.5–15.5)
WBC: 7.3 10*3/uL (ref 4.0–10.5)

## 2017-01-26 LAB — HEPATIC FUNCTION PANEL
ALK PHOS: 79 U/L (ref 39–117)
ALT: 18 U/L (ref 0–35)
AST: 16 U/L (ref 0–37)
Albumin: 3.6 g/dL (ref 3.5–5.2)
BILIRUBIN DIRECT: 0.1 mg/dL (ref 0.0–0.3)
BILIRUBIN TOTAL: 0.6 mg/dL (ref 0.2–1.2)
TOTAL PROTEIN: 6.6 g/dL (ref 6.0–8.3)

## 2017-01-26 LAB — HEMOGLOBIN A1C: HEMOGLOBIN A1C: 6.1 % (ref 4.6–6.5)

## 2017-01-26 LAB — LIPID PANEL
CHOL/HDL RATIO: 3
Cholesterol: 180 mg/dL (ref 0–200)
HDL: 59.1 mg/dL (ref >39.00)
LDL CALC: 101 mg/dL — AB (ref 0–99)
NONHDL: 120.58
Triglycerides: 97 mg/dL (ref 0.0–149.0)
VLDL: 19.4 mg/dL (ref 0.0–40.0)

## 2017-01-26 LAB — TSH: TSH: 2.12 u[IU]/mL (ref 0.35–4.50)

## 2017-01-26 NOTE — Patient Instructions (Addendum)
Continue attention lifestyle intervention healthy eating and exercise . To avoid diabetes and heart disease.   Will notify you  of labs when available.  Call for order dexa scan for next year.  Make sure adequate vit d 800 - 1000 iu per day.  If all ok then yearly check up and labs  Shingrix is new shingles vaccine  Get at pharmacy .  Your EKG is normal today     Preventive Care 65 Years and Older, Female Preventive care refers to lifestyle choices and visits with your health care provider that can promote health and wellness. What does preventive care include?  A yearly physical exam. This is also called an annual well check.  Dental exams once or twice a year.  Routine eye exams. Ask your health care provider how often you should have your eyes checked.  Personal lifestyle choices, including: ? Daily care of your teeth and gums. ? Regular physical activity. ? Eating a healthy diet. ? Avoiding tobacco and drug use. ? Limiting alcohol use. ? Practicing safe sex. ? Taking low-dose aspirin every day. ? Taking vitamin and mineral supplements as recommended by your health care provider. What happens during an annual well check? The services and screenings done by your health care provider during your annual well check will depend on your age, overall health, lifestyle risk factors, and family history of disease. Counseling Your health care provider may ask you questions about your:  Alcohol use.  Tobacco use.  Drug use.  Emotional well-being.  Home and relationship well-being.  Sexual activity.  Eating habits.  History of falls.  Memory and ability to understand (cognition).  Work and work environment.  Reproductive health.  Screening You may have the following tests or measurements:  Height, weight, and BMI.  Blood pressure.  Lipid and cholesterol levels. These may be checked every 5 years, or more frequently if you are over 50 years old.  Skin  check.  Lung cancer screening. You may have this screening every year starting at age 55 if you have a 30-pack-year history of smoking and currently smoke or have quit within the past 15 years.  Fecal occult blood test (FOBT) of the stool. You may have this test every year starting at age 50.  Flexible sigmoidoscopy or colonoscopy. You may have a sigmoidoscopy every 5 years or a colonoscopy every 10 years starting at age 50.  Hepatitis C blood test.  Hepatitis B blood test.  Sexually transmitted disease (STD) testing.  Diabetes screening. This is done by checking your blood sugar (glucose) after you have not eaten for a while (fasting). You may have this done every 1-3 years.  Bone density scan. This is done to screen for osteoporosis. You may have this done starting at age 65.  Mammogram. This may be done every 1-2 years. Talk to your health care provider about how often you should have regular mammograms.  Talk with your health care provider about your test results, treatment options, and if necessary, the need for more tests. Vaccines Your health care provider may recommend certain vaccines, such as:  Influenza vaccine. This is recommended every year.  Tetanus, diphtheria, and acellular pertussis (Tdap, Td) vaccine. You may need a Td booster every 10 years.  Varicella vaccine. You may need this if you have not been vaccinated.  Zoster vaccine. You may need this after age 60.  Measles, mumps, and rubella (MMR) vaccine. You may need at least one dose of MMR if you were   born in [birth year] or later. You may also need a second dose.  Pneumococcal 13-valent conjugate (PCV13) vaccine. One dose is recommended after age 43.  Pneumococcal polysaccharide (PPSV23) vaccine. One dose is recommended after age 62.  Meningococcal vaccine. You may need this if you have certain conditions.  Hepatitis A vaccine. You may need this if you have certain conditions or if you travel or work in places  where you may be exposed to hepatitis A.  Hepatitis B vaccine. You may need this if you have certain conditions or if you travel or work in places where you may be exposed to hepatitis B.  Haemophilus influenzae type b (Hib) vaccine. You may need this if you have certain conditions.  Talk to your health care provider about which screenings and vaccines you need and how often you need them. This information is not intended to replace advice given to you by your health care provider. Make sure you discuss any questions you have with your health care provider. Document Released: 06/21/2015 Document Revised: 02/12/2016 Document Reviewed: 03/26/2015 Elsevier Interactive Patient Education  2017 [employer].

## 2017-02-26 ENCOUNTER — Encounter: Payer: Self-pay | Admitting: Internal Medicine

## 2017-03-01 DIAGNOSIS — H04221 Epiphora due to insufficient drainage, right lacrimal gland: Secondary | ICD-10-CM | POA: Diagnosis not present

## 2017-03-01 DIAGNOSIS — H04561 Stenosis of right lacrimal punctum: Secondary | ICD-10-CM | POA: Diagnosis not present

## 2017-03-01 DIAGNOSIS — H04551 Acquired stenosis of right nasolacrimal duct: Secondary | ICD-10-CM | POA: Diagnosis not present

## 2017-03-01 DIAGNOSIS — H04301 Unspecified dacryocystitis of right lacrimal passage: Secondary | ICD-10-CM | POA: Diagnosis not present

## 2017-03-02 ENCOUNTER — Other Ambulatory Visit: Payer: Self-pay | Admitting: Ophthalmology

## 2017-03-02 DIAGNOSIS — H0419 Other specified disorders of lacrimal gland: Secondary | ICD-10-CM | POA: Diagnosis not present

## 2017-07-18 ENCOUNTER — Other Ambulatory Visit: Payer: Self-pay | Admitting: Internal Medicine

## 2017-07-19 DIAGNOSIS — H2513 Age-related nuclear cataract, bilateral: Secondary | ICD-10-CM | POA: Diagnosis not present

## 2017-07-19 DIAGNOSIS — H524 Presbyopia: Secondary | ICD-10-CM | POA: Diagnosis not present

## 2017-07-19 DIAGNOSIS — H33103 Unspecified retinoschisis, bilateral: Secondary | ICD-10-CM | POA: Diagnosis not present

## 2017-09-21 DIAGNOSIS — M7552 Bursitis of left shoulder: Secondary | ICD-10-CM | POA: Diagnosis not present

## 2018-01-11 DIAGNOSIS — K219 Gastro-esophageal reflux disease without esophagitis: Secondary | ICD-10-CM | POA: Diagnosis not present

## 2018-01-11 DIAGNOSIS — K5900 Constipation, unspecified: Secondary | ICD-10-CM | POA: Diagnosis not present

## 2018-01-11 DIAGNOSIS — K5730 Diverticulosis of large intestine without perforation or abscess without bleeding: Secondary | ICD-10-CM | POA: Diagnosis not present

## 2018-01-16 ENCOUNTER — Other Ambulatory Visit: Payer: Self-pay | Admitting: Internal Medicine

## 2018-01-17 DIAGNOSIS — H33103 Unspecified retinoschisis, bilateral: Secondary | ICD-10-CM | POA: Diagnosis not present

## 2018-02-21 ENCOUNTER — Other Ambulatory Visit: Payer: Self-pay | Admitting: Internal Medicine

## 2018-02-21 DIAGNOSIS — Z1231 Encounter for screening mammogram for malignant neoplasm of breast: Secondary | ICD-10-CM

## 2018-02-25 NOTE — Progress Notes (Signed)
Chief Complaint  Patient presents with  . Annual Exam    No new concerns    HPI: [name] 67 y.o. comes in today for Preventive Medicare exam/ wellness visit  And Chronic disease management   bp usually lower thant today .  On meds  Denies se .   Check vit d and  tsh per dr Collene Mares as on higher dose ppi .   Goal is to lose weight to 200 or below   No risk exposure excep job   And birth year for hep c .   Health Maintenance  Topic Date Due  . Hepatitis C Screening  02-Nov-1950  . FOOT EXAM  10/05/2015  . TETANUS/TDAP  06/08/2017  . HEMOGLOBIN A1C  07/29/2017  . OPHTHALMOLOGY EXAM  07/29/2017  . PNA vac Low Risk Adult (2 of 2 - PPSV23) 01/26/2018  . INFLUENZA VACCINE  09/07/2018 (Originally 01/06/2018)  . MAMMOGRAM  01/12/2019  . COLONOSCOPY  11/17/2026  . DEXA SCAN  Completed   Health Maintenance Review LIFESTYLE:  Exercise:    Pretty active . No sedentary .  Tobacco/ETS: no Alcohol:   Min to rare  Sugar beverages:  min Sleep:so  6   Hours no change  Drug use: no   HH: 2  Dog  Age 29   25 - 30 hours per week.  hsa had first shingrix    Hearing:ok   Vision:  No limitations at present . Last eye check UTD  Safety:  Has smoke detector and wears seat belts.  . No excess sun exposure. Sees dentist regularly.  Falls: n  Memory: Felt to be good  , no concern from her or her family.  Depression: No anhedonia unusual crying or depressive symptoms  Nutrition: Eats well balanced diet; adequate calcium and vitamin D. No swallowing chewing problems.  Injury: no major injuries in the last six months.  Other healthcare providers:  Reviewed today   Preventive parameters: up-to-date  Reviewed   ADLS:   There are no problems or need for assistance  driving, feeding, obtaining food, dressing, toileting and bathing, managing money using phone. She is independent. Working    ROS:  GEN/ HEENT: No fever, significant weight changes sweats headaches vision problems  hearing changes, CV/ PULM; No chest pain shortness of breath cough, syncope,edema  change in exercise tolerance. GI /GU: No adominal pain, vomiting, change in bowel habits. No blood in the stool. No significant GU symptoms. SKIN/HEME: ,no acute skin rashes suspicious lesions or bleeding. No lymphadenopathy, nodules, masses.  NEURO/ PSYCH:  No neurologic signs such as weakness numbness. No depression anxiety. IMM/ Allergy: No unusual infections.  Allergy .   REST of 12 system review negative except as per HPI   Past Medical History:  Diagnosis Date  . Diverticulitis   . GERD (gastroesophageal reflux disease)   . 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 Hx of varicella   . Hypertension     Family History  Problem Relation Age of Onset  . Hyperlipidemia Mother   . Hypertension Mother   . COPD Mother        Deceased  . Hypertension Father   . Cancer - Other Father        Liver deceased  . Hypertension Sister   . Stroke Sister   . Arthritis Sister   . Obstructive Sleep Apnea Sister        And  secondary atrial fibrillation.    Social History   Socioeconomic History  . Marital status: Married    Spouse name: Not on file  . Number of children: Not on file  . Years of education: Not on file  . Highest education level: Not on file  Occupational History  . Not on file  Social Needs  . Financial resource strain: Not on file  . Food insecurity:    Worry: Not on file    Inability: Not on file  . Transportation needs:    Medical: Not on file    Non-medical: Not on file  Tobacco Use  . Smoking status: Never Smoker  . Smokeless tobacco: Never Used  Substance and Sexual Activity  . Alcohol use: Yes    Alcohol/week: 0.0 standard drinks    Comment: seldom  . Drug use: No  . Sexual activity: Not Currently    Partners: Male    Birth control/protection: Other-see comments    Comment: husband had prostate cancer and BTL    Lifestyle  . Physical activity:    Days per week: Not on file    Minutes per session: Not on file  . Stress: Not on file  Relationships  . Social connections:    Talks on phone: Not on file    Gets together: Not on file    Attends religious service: Not on file    Active member of club or organization: Not on file    Attends meetings of clubs or organizations: Not on file    Relationship status: Not on file  Other Topics Concern  . 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 benazepril-hydrochlorthiazide (LOTENSIN HCT) 20-12.5 MG tablet TAKE 1 TABLET BY MOUTH DAILY.  Marland Kitchen omeprazole (PRILOSEC) 40 MG capsule TAKE 1 CAPSULE 20 MINUTES BEFORE BREAKFAST DAILY   No facility-administered encounter medications on file as of 02/28/2018.     EXAM:  BP 138/72 (BP Location: Right Wrist, Patient Position: Sitting, Cuff Size: Normal)   Pulse 70   Temp 98.2 F (36.8 C) (Oral)   Ht 5' 2.6" (1.59 m)   Wt 215 lb 6.4 oz (97.7 kg)   LMP 06/08/2004   BMI 38.65 kg/m   Body mass index is 38.65 kg/m.  Physical Exam: Vital signs reviewed ZCH:YIFO is a well-developed well-nourished alert cooperative   who appears stated age in no acute distress.  HEENT: normocephalic atraumatic , Eyes: PERRL EOM's full, conjunctiva clear, Nares: paten,t no deformity discharge or tenderness., Ears: no deformity EAC's clear TMs with normal landmarks. Mouth: clear OP, no lesions, edema.  Moist mucous membranes. Dentition in adequate repair. NECK: supple without masses, thyromegaly or bruits. CHEST/PULM:  Clear to auscultation and percussion breath sounds equal no wheeze , rales or rhonchi. No chest wall deformities or tenderness.Breast:  normal by inspection . No dimpling, discharge, masses, tenderness or discharge . CV: PMI is nondisplaced, S1 S2 no gallops, murmurs, rubs. Peripheral pulses are full without delay.No JVD .  ABDOMEN: Bowel sounds normal nontender  No guard or rebound, no hepato splenomegal no CVA tenderness.   Extremtities:  No clubbing cyanosis or edema, no  acute joint swelling or redness no focal atrophy NEURO:  Oriented x3, cranial nerves 3-12 appear to be intact, no obvious focal weakness,gait within normal limits no abnormal reflexes or asymmetrical SKIN: No acute rashes normal turgor, color, no bruising or petechiae. PSYCH: Oriented, good eye contact, no obvious depression anxiety, cognition and judgment appear normal. LN: no cervical axillary inguinal adenopathy No noted deficits in memory, attention, and speech.   Lab Results  Component Value Date   WBC 8.0 02/28/2018   HGB 13.3 02/28/2018   HCT 38.4 02/28/2018   PLT 326.0 02/28/2018   GLUCOSE 108 (H) 02/28/2018   CHOL 211 (H) 02/28/2018   TRIG 94.0 02/28/2018   HDL 62.60 02/28/2018   LDLCALC 129 (H) 02/28/2018   ALT 17 02/28/2018   AST 13 02/28/2018   NA 139 02/28/2018   K 3.9 02/28/2018   CL 98 02/28/2018   CREATININE 0.81 02/28/2018   BUN 20 02/28/2018   CO2 33 (H) 02/28/2018   TSH 1.87 02/28/2018   HGBA1C 6.2 02/28/2018   MICROALBUR 0.2 03/23/2014   ASSESSMENT AND PLAN:  Discussed the following assessment and plan:  Visit for preventive health examination  Hyperglycemia - Plan: Basic metabolic panel, CBC with Differential/Platelet, Hemoglobin A1c, Hepatic function panel, Lipid panel, TSH, T4, free, VITAMIN D 25 Hydroxy (Vit-D Deficiency, Fractures)  Essential hypertension - Plan: Basic metabolic panel, CBC with Differential/Platelet, Hemoglobin A1c, Hepatic function panel, Lipid panel, TSH, T4, free, VITAMIN D 25 Hydroxy (Vit-D Deficiency, Fractures)  Gastroesophageal reflux disease, esophagitis presence not specified - Plan:  Basic metabolic panel, CBC with Differential/Platelet, Hemoglobin A1c, Hepatic function panel, Lipid panel, TSH, T4, free, VITAMIN D 25 Hydroxy (Vit-D Deficiency, Fractures)  Medication management - Plan: Basic metabolic panel, CBC with Differential/Platelet, Hemoglobin A1c, Hepatic function panel, Lipid panel, TSH, T4, free, VITAMIN D 25 Hydroxy (Vit-D Deficiency, Fractures)  Hyperlipidemia, unspecified hyperlipidemia type - Plan: Basic metabolic panel, CBC with Differential/Platelet, Hemoglobin A1c, Hepatic function panel, Lipid panel, TSH, T4, free, VITAMIN D 25 Hydroxy (Vit-D Deficiency, Fractures)  Need for hepatitis C screening test - Plan: Hepatitis C antibody  Vitamin D deficiency - Plan: VITAMIN D 25 Hydroxy (Vit-D Deficiency, Fractures)  BMI 38.0-38.9,adult  Need for pneumococcal vaccination - Plan: Pneumococcal polysaccharide vaccine 23-valent greater than or equal to 2yo subcutaneous/IM Counseled.  On chronic  ppi  Advise some weight loss to help medical problems and pat aware of this  Patient Care Team: Ivone Licht, Standley Brooking, MD as PCP - General (Internal Medicine) Juanita Craver, MD as Attending Physician (Gastroenterology) Jola Schmidt, MD as Consulting Physician (Ophthalmology) Megan Salon, MD as Consulting Physician (Gynecology)  Patient Instructions  Will notify you  of labs when available.  Checking for elevated blood sugars   Etc   At risk to develop diabetes .    Continueattention lifestyle intervention healthier  eating and activity  5- 10 pound weight loss s can help your reflux and  Blood sugars .   Attention to tracking intake avoiding processed carbohydrates and sugars  Such as breads  Sweets, chips etc.  And aovid dried fruit  . Let uw kjnow if you want to talk with a dietician .   Will send results to dr Collene Mares  When available .   Health Maintenance, Female Adopting a healthy lifestyle and getting preventive care can go a long way to promote health and  wellness. Talk with your health care provider about what schedule of regular examinations is right for you. This is a good chance  for you to check in with your provider about disease prevention and staying healthy. In between checkups, there are plenty of things you can do on your own. Experts have done a lot of research about which lifestyle changes and preventive measures are most likely to keep you healthy. Ask your health care provider for more information. Weight and diet Eat a healthy diet  Be sure to include plenty of vegetables, fruits, low-fat dairy products, and lean protein.  Do not eat a lot of foods high in solid fats, added sugars, or salt.  Get regular exercise. This is one of the most important things you can do for your health. ? Most adults should exercise for at least 150 minutes each week. The exercise should increase your heart rate and make you sweat (moderate-intensity exercise). ? Most adults should also do strengthening exercises at least twice a week. This is in addition to the moderate-intensity exercise.  Maintain a healthy weight  Body mass index (BMI) is a measurement that can be used to identify possible weight problems. It estimates body fat based on height and weight. Your health care provider can help determine your BMI and help you achieve or maintain a healthy weight.  For females 32 years of age and older: ? A BMI below 18.5 is considered underweight. ? A BMI of 18.5 to 24.9 is normal. ? A BMI of 25 to 29.9 is considered overweight. ? A BMI of 30 and above is considered obese.  Watch levels of cholesterol and blood lipids  You should start having your blood tested for lipids and cholesterol at 66 years of age, then have this test every 5 years.  You may need to have your cholesterol levels checked more often if: ? Your lipid or cholesterol levels are high. ? You are older than 65 years of age. ? You are at high risk for heart disease.  Cancer  screening Lung Cancer  Lung cancer screening is recommended for adults 17-8 years old who are at high risk for lung cancer because of a history of smoking.  A yearly low-dose CT scan of the lungs is recommended for people who: ? Currently smoke. ? Have quit within the past 15 years. ? Have at least a 30-pack-year history of smoking. A pack year is smoking an average of one pack of cigarettes a day for 1 year.  Yearly screening should continue until it has been 15 years since you quit.  Yearly screening should stop if you develop a health problem that would prevent you from having lung cancer treatment.  Breast Cancer  Practice breast self-awareness. This means understanding how your breasts normally appear and feel.  It also means doing regular breast self-exams. Let your health care provider know about any changes, no matter how small.  If you are in your 20s or 30s, you should have a clinical breast exam (CBE) by a health care provider every 1-3 years as part of a regular health exam.  If you are 23 or older, have a CBE every year. Also consider having a breast X-ray (mammogram) every year.  If you have a family history of breast cancer, talk to your health care provider about genetic screening.  If you are at high risk for breast cancer, talk to your health care provider about having an MRI and a mammogram every year.  Breast cancer gene (BRCA) assessment is recommended for women who have family members with BRCA-related cancers. BRCA-related cancers include: ? Breast. ? Ovarian. ?  Tubal. ? Peritoneal cancers.  Results of the assessment will determine the need for genetic counseling and BRCA1 and BRCA2 testing.  Cervical Cancer Your health care provider may recommend that you be screened regularly for cancer of the pelvic organs (ovaries, uterus, and vagina). This screening involves a pelvic examination, including checking for microscopic changes to the surface of your cervix  (Pap test). You may be encouraged to have this screening done every 3 years, beginning at age 34.  For women ages 61-65, health care providers may recommend pelvic exams and Pap testing every 3 years, or they may recommend the Pap and pelvic exam, combined with testing for human papilloma virus (HPV), every 5 years. Some types of HPV increase your risk of cervical cancer. Testing for HPV may also be done on women of any age with unclear Pap test results.  Other health care providers may not recommend any screening for nonpregnant women who are considered low risk for pelvic cancer and who do not have symptoms. Ask your health care provider if a screening pelvic exam is right for you.  If you have had past treatment for cervical cancer or a condition that could lead to cancer, you need Pap tests and screening for cancer for at least 20 years after your treatment. If Pap tests have been discontinued, your risk factors (such as having a new sexual partner) need to be reassessed to determine if screening should resume. Some women have medical problems that increase the chance of getting cervical cancer. In these cases, your health care provider may recommend more frequent screening and Pap tests.  Colorectal Cancer  This type of cancer can be detected and often prevented.  Routine colorectal cancer screening usually begins at 66 years of age and continues through 66 years of age.  Your health care provider may recommend screening at an earlier age if you have risk factors for colon cancer.  Your health care provider may also recommend using home test kits to check for hidden blood in the stool.  A small camera at the end of a tube can be used to examine your colon directly (sigmoidoscopy or colonoscopy). This is done to check for the earliest forms of colorectal cancer.  Routine screening usually begins at age 27.  Direct examination of the colon should be repeated every 5-10 years through 66 years  of age. However, you may need to be screened more often if early forms of precancerous polyps or small growths are found.  Skin Cancer  Check your skin from head to toe regularly.  Tell your health care provider about any new moles or changes in moles, especially if there is a change in a mole's shape or color.  Also tell your health care provider if you have a mole that is larger than the size of a pencil eraser.  Always use sunscreen. Apply sunscreen liberally and repeatedly throughout the day.  Protect yourself by wearing long sleeves, pants, a wide-brimmed hat, and sunglasses whenever you are outside.  Heart disease, diabetes, and high blood pressure  High blood pressure causes heart disease and increases the risk of stroke. High blood pressure is more likely to develop in: ? People who have blood pressure in the high end of the normal range (130-139/85-89 mm Hg). ? People who are overweight or obese. ? People who are African American.  If you are 63-38 years of age, have your blood pressure checked every 3-5 years. If you are 31 years of age or  older, have your blood pressure checked every year. You should have your blood pressure measured twice-once when you are at a hospital or clinic, and once when you are not at a hospital or clinic. Record the average of the two measurements. To check your blood pressure when you are not at a hospital or clinic, you can use: ? An automated blood pressure machine at a pharmacy. ? A home blood pressure monitor.  If you are between 63 years and 65 years old, ask your health care provider if you should take aspirin to prevent strokes.  Have regular diabetes screenings. This involves taking a blood sample to check your fasting blood sugar level. ? If you are at a normal weight and have a low risk for diabetes, have this test once every three years after 66 years of age. ? If you are overweight and have a high risk for diabetes, consider being tested  at a younger age or more often. Preventing infection Hepatitis B  If you have a higher risk for hepatitis B, you should be screened for this virus. You are considered at high risk for hepatitis B if: ? You were born in a country where hepatitis B is common. Ask your health care provider which countries are considered high risk. ? Your parents were born in a high-risk country, and you have not been immunized against hepatitis B (hepatitis B vaccine). ? You have HIV or AIDS. ? You use needles to inject street drugs. ? You live with someone who has hepatitis B. ? You have had sex with someone who has hepatitis B. ? You get hemodialysis treatment. ? You take certain medicines for conditions, including cancer, organ transplantation, and autoimmune conditions.  Hepatitis C  Blood testing is recommended for: ? Everyone born from [birth year] through 1964. ? Anyone with known risk factors for hepatitis C.  Sexually transmitted infections (STIs)  You should be screened for sexually transmitted infections (STIs) including gonorrhea and chlamydia if: ? You are sexually active and are younger than 66 years of age. ? You are older than 66 years of age and your health care provider tells you that you are at risk for this type of infection. ? Your sexual activity has changed since you were last screened and you are at an increased risk for chlamydia or gonorrhea. Ask your health care provider if you are at risk.  If you do not have HIV, but are at risk, it may be recommended that you take a prescription medicine daily to prevent HIV infection. This is called pre-exposure prophylaxis (PrEP). You are considered at risk if: ? You are sexually active and do not regularly use condoms or know the HIV status of your partner(s). ? You take drugs by injection. ? You are sexually active with a partner who has HIV.  Talk with your health care provider about whether you are at high risk of being infected with HIV. If  you choose to begin PrEP, you should first be tested for HIV. You should then be tested every 3 months for as long as you are taking PrEP. Pregnancy  If you are premenopausal and you may become pregnant, ask your health care provider about preconception counseling.  If you may become pregnant, take 400 to 800 micrograms (mcg) of folic acid every day.  If you want to prevent pregnancy, talk to your health care provider about birth control (contraception). Osteoporosis and menopause  Osteoporosis is a disease in which the bones lose minerals  and strength with aging. This can result in serious bone fractures. Your risk for osteoporosis can be identified using a bone density scan.  If you are 1 years of age or older, or if you are at risk for osteoporosis and fractures, ask your health care provider if you should be screened.  Ask your health care provider whether you should take a calcium or vitamin D supplement to lower your risk for osteoporosis.  Menopause may have certain physical symptoms and risks.  Hormone replacement therapy may reduce some of these symptoms and risks. Talk to your health care provider about whether hormone replacement therapy is right for you. Follow these instructions at home:  Schedule regular health, dental, and eye exams.  Stay current with your immunizations.  Do not use any tobacco products including cigarettes, chewing tobacco, or electronic cigarettes.  If you are pregnant, do not drink alcohol.  If you are breastfeeding, limit how much and how often you drink alcohol.  Limit alcohol intake to no more than 1 drink per day for nonpregnant women. One drink equals 12 ounces of beer, 5 ounces of wine, or 1 ounces of hard liquor.  Do not use street drugs.  Do not share needles.  Ask your health care provider for help if you need support or information about quitting drugs.  Tell your health care provider if you often feel depressed.  Tell your  health care provider if you have ever been abused or do not feel safe at home. This information is not intended to replace advice given to you by your health care provider. Make sure you discuss any questions you have with your health care provider. Document Released: 12/08/2010 Document Revised: 10/31/2015 Document Reviewed: 02/26/2015 Elsevier Interactive Patient Education  2018 [place]. Bader Stubblefield M.D.

## 2018-02-28 ENCOUNTER — Encounter: Payer: Self-pay | Admitting: Internal Medicine

## 2018-02-28 ENCOUNTER — Ambulatory Visit (INDEPENDENT_AMBULATORY_CARE_PROVIDER_SITE_OTHER): Payer: Medicare HMO | Admitting: Internal Medicine

## 2018-02-28 VITALS — BP 138/72 | HR 70 | Temp 98.2°F | Ht 62.6 in | Wt 215.4 lb

## 2018-02-28 DIAGNOSIS — E559 Vitamin D deficiency, unspecified: Secondary | ICD-10-CM

## 2018-02-28 DIAGNOSIS — K219 Gastro-esophageal reflux disease without esophagitis: Secondary | ICD-10-CM

## 2018-02-28 DIAGNOSIS — Z79899 Other long term (current) drug therapy: Secondary | ICD-10-CM | POA: Diagnosis not present

## 2018-02-28 DIAGNOSIS — R739 Hyperglycemia, unspecified: Secondary | ICD-10-CM | POA: Diagnosis not present

## 2018-02-28 DIAGNOSIS — Z23 Encounter for immunization: Secondary | ICD-10-CM

## 2018-02-28 DIAGNOSIS — Z0000 Encounter for general adult medical examination without abnormal findings: Secondary | ICD-10-CM

## 2018-02-28 DIAGNOSIS — I10 Essential (primary) hypertension: Secondary | ICD-10-CM | POA: Diagnosis not present

## 2018-02-28 DIAGNOSIS — E785 Hyperlipidemia, unspecified: Secondary | ICD-10-CM

## 2018-02-28 DIAGNOSIS — Z1159 Encounter for screening for other viral diseases: Secondary | ICD-10-CM

## 2018-02-28 DIAGNOSIS — Z6838 Body mass index (BMI) 38.0-38.9, adult: Secondary | ICD-10-CM

## 2018-02-28 LAB — CBC WITH DIFFERENTIAL/PLATELET
BASOS PCT: 0.6 % (ref 0.0–3.0)
Basophils Absolute: 0 10*3/uL (ref 0.0–0.1)
EOS PCT: 3 % (ref 0.0–5.0)
Eosinophils Absolute: 0.2 10*3/uL (ref 0.0–0.7)
HEMATOCRIT: 38.4 % (ref 36.0–46.0)
HEMOGLOBIN: 13.3 g/dL (ref 12.0–15.0)
LYMPHS PCT: 26.1 % (ref 12.0–46.0)
Lymphs Abs: 2.1 10*3/uL (ref 0.7–4.0)
MCHC: 34.6 g/dL (ref 30.0–36.0)
MCV: 84.6 fl (ref 78.0–100.0)
MONOS PCT: 4.5 % (ref 3.0–12.0)
Monocytes Absolute: 0.4 10*3/uL (ref 0.1–1.0)
NEUTROS ABS: 5.2 10*3/uL (ref 1.4–7.7)
Neutrophils Relative %: 65.8 % (ref 43.0–77.0)
PLATELETS: 326 10*3/uL (ref 150.0–400.0)
RBC: 4.54 Mil/uL (ref 3.87–5.11)
RDW: 13.1 % (ref 11.5–15.5)
WBC: 8 10*3/uL (ref 4.0–10.5)

## 2018-02-28 LAB — HEPATIC FUNCTION PANEL
ALT: 17 U/L (ref 0–35)
AST: 13 U/L (ref 0–37)
Albumin: 4.2 g/dL (ref 3.5–5.2)
Alkaline Phosphatase: 89 U/L (ref 39–117)
Bilirubin, Direct: 0.1 mg/dL (ref 0.0–0.3)
TOTAL PROTEIN: 7.1 g/dL (ref 6.0–8.3)
Total Bilirubin: 0.6 mg/dL (ref 0.2–1.2)

## 2018-02-28 LAB — LIPID PANEL
CHOL/HDL RATIO: 3
CHOLESTEROL: 211 mg/dL — AB (ref 0–200)
HDL: 62.6 mg/dL (ref >39.00)
LDL Cholesterol: 129 mg/dL — ABNORMAL HIGH (ref 0–99)
NonHDL: 148.14
TRIGLYCERIDES: 94 mg/dL (ref 0.0–149.0)
VLDL: 18.8 mg/dL (ref 0.0–40.0)

## 2018-02-28 LAB — BASIC METABOLIC PANEL
BUN: 20 mg/dL (ref 6–23)
CHLORIDE: 98 meq/L (ref 96–112)
CO2: 33 meq/L — AB (ref 19–32)
Calcium: 9.4 mg/dL (ref 8.4–10.5)
Creatinine, Ser: 0.81 mg/dL (ref 0.40–1.20)
GFR: 75.12 mL/min (ref >60.00)
Glucose, Bld: 108 mg/dL — ABNORMAL HIGH (ref 70–99)
POTASSIUM: 3.9 meq/L (ref 3.5–5.1)
SODIUM: 139 meq/L (ref 135–145)

## 2018-02-28 LAB — TSH: TSH: 1.87 u[IU]/mL (ref 0.35–4.50)

## 2018-02-28 LAB — HEMOGLOBIN A1C: Hgb A1c MFr Bld: 6.2 % (ref 4.6–6.5)

## 2018-02-28 LAB — VITAMIN D 25 HYDROXY (VIT D DEFICIENCY, FRACTURES): VITD: 10.28 ng/mL — AB (ref 30.00–100.00)

## 2018-02-28 LAB — T4, FREE: FREE T4: 0.76 ng/dL (ref 0.60–1.60)

## 2018-02-28 NOTE — Patient Instructions (Addendum)
Will notify you  of labs when available.  Checking for elevated blood sugars   Etc   At risk to develop diabetes .    Continueattention lifestyle intervention healthier  eating and activity  5- 10 pound weight loss s can help your reflux and  Blood sugars .   Attention to tracking intake avoiding processed carbohydrates and sugars  Such as breads  Sweets, chips etc.  And aovid dried fruit  . Let uw kjnow if you want to talk with a dietician .   Will send results to dr Collene Mares  When available .   Health Maintenance, Female Adopting a healthy lifestyle and getting preventive care can go a long way to promote health and wellness. Talk with your health care provider about what schedule of regular examinations is right for you. This is a good chance for you to check in with your provider about disease prevention and staying healthy. In between checkups, there are plenty of things you can do on your own. Experts have done a lot of research about which lifestyle changes and preventive measures are most likely to keep you healthy. Ask your health care provider for more information. Weight and diet Eat a healthy diet  Be sure to include plenty of vegetables, fruits, low-fat dairy products, and lean protein.  Do not eat a lot of foods high in solid fats, added sugars, or salt.  Get regular exercise. This is one of the most important things you can do for your health. ? Most adults should exercise for at least 150 minutes each week. The exercise should increase your heart rate and make you sweat (moderate-intensity exercise). ? Most adults should also do strengthening exercises at least twice a week. This is in addition to the moderate-intensity exercise.  Maintain a healthy weight  Body mass index (BMI) is a measurement that can be used to identify possible weight problems. It estimates body fat based on height and weight. Your health care provider can help determine your BMI and help you achieve or  maintain a healthy weight.  For females 84 years of age and older: ? A BMI below 18.5 is considered underweight. ? A BMI of 18.5 to 24.9 is normal. ? A BMI of 25 to 29.9 is considered overweight. ? A BMI of 30 and above is considered obese.  Watch levels of cholesterol and blood lipids  You should start having your blood tested for lipids and cholesterol at 66 years of age, then have this test every 5 years.  You may need to have your cholesterol levels checked more often if: ? Your lipid or cholesterol levels are high. ? You are older than 66 years of age. ? You are at high risk for heart disease.  Cancer screening Lung Cancer  Lung cancer screening is recommended for adults 68-56 years old who are at high risk for lung cancer because of a history of smoking.  A yearly low-dose CT scan of the lungs is recommended for people who: ? Currently smoke. ? Have quit within the past 15 years. ? Have at least a 30-pack-year history of smoking. A pack year is smoking an average of one pack of cigarettes a day for 1 year.  Yearly screening should continue until it has been 15 years since you quit.  Yearly screening should stop if you develop a health problem that would prevent you from having lung cancer treatment.  Breast Cancer  Practice breast self-awareness. This means understanding how your breasts  normally appear and feel.  It also means doing regular breast self-exams. Let your health care provider know about any changes, no matter how small.  If you are in your 20s or 30s, you should have a clinical breast exam (CBE) by a health care provider every 1-3 years as part of a regular health exam.  If you are 51 or older, have a CBE every year. Also consider having a breast X-ray (mammogram) every year.  If you have a family history of breast cancer, talk to your health care provider about genetic screening.  If you are at high risk for breast cancer, talk to your health care  provider about having an MRI and a mammogram every year.  Breast cancer gene (BRCA) assessment is recommended for women who have family members with BRCA-related cancers. BRCA-related cancers include: ? Breast. ? Ovarian. ? Tubal. ? Peritoneal cancers.  Results of the assessment will determine the need for genetic counseling and BRCA1 and BRCA2 testing.  Cervical Cancer Your health care provider may recommend that you be screened regularly for cancer of the pelvic organs (ovaries, uterus, and vagina). This screening involves a pelvic examination, including checking for microscopic changes to the surface of your cervix (Pap test). You may be encouraged to have this screening done every 3 years, beginning at age 24.  For women ages 48-65, health care providers may recommend pelvic exams and Pap testing every 3 years, or they may recommend the Pap and pelvic exam, combined with testing for human papilloma virus (HPV), every 5 years. Some types of HPV increase your risk of cervical cancer. Testing for HPV may also be done on women of any age with unclear Pap test results.  Other health care providers may not recommend any screening for nonpregnant women who are considered low risk for pelvic cancer and who do not have symptoms. Ask your health care provider if a screening pelvic exam is right for you.  If you have had past treatment for cervical cancer or a condition that could lead to cancer, you need Pap tests and screening for cancer for at least 20 years after your treatment. If Pap tests have been discontinued, your risk factors (such as having a new sexual partner) need to be reassessed to determine if screening should resume. Some women have medical problems that increase the chance of getting cervical cancer. In these cases, your health care provider may recommend more frequent screening and Pap tests.  Colorectal Cancer  This type of cancer can be detected and often prevented.  Routine  colorectal cancer screening usually begins at 66 years of age and continues through 66 years of age.  Your health care provider may recommend screening at an earlier age if you have risk factors for colon cancer.  Your health care provider may also recommend using home test kits to check for hidden blood in the stool.  A small camera at the end of a tube can be used to examine your colon directly (sigmoidoscopy or colonoscopy). This is done to check for the earliest forms of colorectal cancer.  Routine screening usually begins at age 77.  Direct examination of the colon should be repeated every 5-10 years through 66 years of age. However, you may need to be screened more often if early forms of precancerous polyps or small growths are found.  Skin Cancer  Check your skin from head to toe regularly.  Tell your health care provider about any new moles or changes in moles,  especially if there is a change in a mole's shape or color.  Also tell your health care provider if you have a mole that is larger than the size of a pencil eraser.  Always use sunscreen. Apply sunscreen liberally and repeatedly throughout the day.  Protect yourself by wearing long sleeves, pants, a wide-brimmed hat, and sunglasses whenever you are outside.  Heart disease, diabetes, and high blood pressure  High blood pressure causes heart disease and increases the risk of stroke. High blood pressure is more likely to develop in: ? People who have blood pressure in the high end of the normal range (130-139/85-89 mm Hg). ? People who are overweight or obese. ? People who are African American.  If you are 69-73 years of age, have your blood pressure checked every 3-5 years. If you are 44 years of age or older, have your blood pressure checked every year. You should have your blood pressure measured twice-once when you are at a hospital or clinic, and once when you are not at a hospital or clinic. Record the average of the  two measurements. To check your blood pressure when you are not at a hospital or clinic, you can use: ? An automated blood pressure machine at a pharmacy. ? A home blood pressure monitor.  If you are between 79 years and 9 years old, ask your health care provider if you should take aspirin to prevent strokes.  Have regular diabetes screenings. This involves taking a blood sample to check your fasting blood sugar level. ? If you are at a normal weight and have a low risk for diabetes, have this test once every three years after 66 years of age. ? If you are overweight and have a high risk for diabetes, consider being tested at a younger age or more often. Preventing infection Hepatitis B  If you have a higher risk for hepatitis B, you should be screened for this virus. You are considered at high risk for hepatitis B if: ? You were born in a country where hepatitis B is common. Ask your health care provider which countries are considered high risk. ? Your parents were born in a high-risk country, and you have not been immunized against hepatitis B (hepatitis B vaccine). ? You have HIV or AIDS. ? You use needles to inject street drugs. ? You live with someone who has hepatitis B. ? You have had sex with someone who has hepatitis B. ? You get hemodialysis treatment. ? You take certain medicines for conditions, including cancer, organ transplantation, and autoimmune conditions.  Hepatitis C  Blood testing is recommended for: ? Everyone born from [birth year] through 1964. ? Anyone with known risk factors for hepatitis C.  Sexually transmitted infections (STIs)  You should be screened for sexually transmitted infections (STIs) including gonorrhea and chlamydia if: ? You are sexually active and are younger than 66 years of age. ? You are older than 66 years of age and your health care provider tells you that you are at risk for this type of infection. ? Your sexual activity has changed since you  were last screened and you are at an increased risk for chlamydia or gonorrhea. Ask your health care provider if you are at risk.  If you do not have HIV, but are at risk, it may be recommended that you take a prescription medicine daily to prevent HIV infection. This is called pre-exposure prophylaxis (PrEP). You are considered at risk if: ? You are sexually  active and do not regularly use condoms or know the HIV status of your partner(s). ? You take drugs by injection. ? You are sexually active with a partner who has HIV.  Talk with your health care provider about whether you are at high risk of being infected with HIV. If you choose to begin PrEP, you should first be tested for HIV. You should then be tested every 3 months for as long as you are taking PrEP. Pregnancy  If you are premenopausal and you may become pregnant, ask your health care provider about preconception counseling.  If you may become pregnant, take 400 to 800 micrograms (mcg) of folic acid every day.  If you want to prevent pregnancy, talk to your health care provider about birth control (contraception). Osteoporosis and menopause  Osteoporosis is a disease in which the bones lose minerals and strength with aging. This can result in serious bone fractures. Your risk for osteoporosis can be identified using a bone density scan.  If you are 54 years of age or older, or if you are at risk for osteoporosis and fractures, ask your health care provider if you should be screened.  Ask your health care provider whether you should take a calcium or vitamin D supplement to lower your risk for osteoporosis.  Menopause may have certain physical symptoms and risks.  Hormone replacement therapy may reduce some of these symptoms and risks. Talk to your health care provider about whether hormone replacement therapy is right for you. Follow these instructions at home:  Schedule regular health, dental, and eye exams.  Stay current  with your immunizations.  Do not use any tobacco products including cigarettes, chewing tobacco, or electronic cigarettes.  If you are pregnant, do not drink alcohol.  If you are breastfeeding, limit how much and how often you drink alcohol.  Limit alcohol intake to no more than 1 drink per day for nonpregnant women. One drink equals 12 ounces of beer, 5 ounces of wine, or 1 ounces of hard liquor.  Do not use street drugs.  Do not share needles.  Ask your health care provider for help if you need support or information about quitting drugs.  Tell your health care provider if you often feel depressed.  Tell your health care provider if you have ever been abused or do not feel safe at home. This information is not intended to replace advice given to you by your health care provider. Make sure you discuss any questions you have with your health care provider. Document Released: 12/08/2010 Document Revised: 10/31/2015 Document Reviewed: 02/26/2015 [employer] Interactive Patient Education  [employer].

## 2018-03-01 LAB — HEPATITIS C ANTIBODY
Hepatitis C Ab: NONREACTIVE
SIGNAL TO CUT-OFF: 0.04 (ref <1.00)

## 2018-03-28 ENCOUNTER — Ambulatory Visit
Admission: RE | Admit: 2018-03-28 | Discharge: 2018-03-28 | Disposition: A | Discharge status: Home / Self Care | Payer: Medicare HMO | Source: Ambulatory Visit | Attending: Internal Medicine | Admitting: Internal Medicine

## 2018-03-28 DIAGNOSIS — Z1231 Encounter for screening mammogram for malignant neoplasm of breast: Secondary | ICD-10-CM

## 2018-07-14 ENCOUNTER — Other Ambulatory Visit: Payer: Self-pay | Admitting: Internal Medicine

## 2019-01-02 ENCOUNTER — Other Ambulatory Visit: Payer: Self-pay | Admitting: Internal Medicine

## 2019-01-12 DIAGNOSIS — M25511 Pain in right shoulder: Secondary | ICD-10-CM | POA: Diagnosis not present

## 2019-01-24 DIAGNOSIS — D3131 Benign neoplasm of right choroid: Secondary | ICD-10-CM | POA: Diagnosis not present

## 2019-01-24 DIAGNOSIS — H33103 Unspecified retinoschisis, bilateral: Secondary | ICD-10-CM | POA: Diagnosis not present

## 2019-01-24 DIAGNOSIS — H524 Presbyopia: Secondary | ICD-10-CM | POA: Diagnosis not present

## 2019-02-02 DIAGNOSIS — R69 Illness, unspecified: Secondary | ICD-10-CM | POA: Diagnosis not present

## 2019-02-27 NOTE — Progress Notes (Signed)
Chief Complaint  Patient presents with  . Annual Exam    Pt would like to talk about vitamin d  . Hypertension  . Medication Management    HPI: [name] 68 y.o. comes in today for Preventive Medicare exam/ wellness visit .Since last visit. Taking omeprezole  .  dialy to control gerd  Sees Dr Collene Mares yearly  She never notified her about what to do with the vit d level  she is not taking supplements  but not a restrictive diet   BP: says at range  On meds   Retired when Dr Thornell Mule move and closed office in MAY  An doing ok at home  Walking more  Health Maintenance  Topic Date Due  . TETANUS/TDAP  06/08/2017  . HEMOGLOBIN A1C  08/29/2018  . MAMMOGRAM  03/28/2020  . COLONOSCOPY  11/17/2026  . INFLUENZA VACCINE  Completed  . DEXA SCAN  Completed  . Hepatitis C Screening  Completed  . PNA vac Low Risk Adult  Completed   Health Maintenance Review LIFESTYLE:  Exercise:  Walking a  Lot since retired .  3 x per week  3 miles  Tobacco/ETS: no Alcohol:  Very little  Sugar beverages: cut back  Sweet tea.  Sleep: the same 6 not a good  Drug use: no HH: 2     Hearing: ok  Vision:  No limitations at present . Last eye check UTD glasses   Safety:  Has smoke detector and wears seat belts.   No excess sun exposure. Sees dentist regularly.  Falls: no.  Memory: Felt to be good  , no concern from her or her family.  Depression: No anhedonia unusual crying or depressive symptoms  Nutrition: Eats well balanced diet;  Some sweet tea  Some dairly  No swallowing chewing problems.  Injury: no major injuries in the last six months.  Other healthcare providers:  Reviewed today .   Preventive parameters:   Reviewed   ADLS:   There are no problems or need for assistance  driving, feeding, obtaining food, dressing, toileting and bathing, managing money using phone. She is independent.   ROS:  VV dont botehr her  GEN/ HEENT: No fever, significant weight changes sweats headaches vision  problems hearing changes, CV/ PULM; No chest pain shortness of breath cough, syncope,edema  change in exercise tolerance. GI /GU: No adominal pain, vomiting, change in bowel habits. No blood in the stool. No significant GU symptoms. SKIN/HEME: ,no acute skin rashes suspicious lesions or bleeding. No lymphadenopathy, nodules, masses.  NEURO/ PSYCH:  No neurologic signs such as weakness numbness. No depression anxiety. IMM/ Allergy: No unusual infections.  Allergy .   REST of 12 system review negative except as per HPI   Past Medical History:  Diagnosis Date  . Diverticulitis   . GERD (gastroesophageal reflux disease)   . 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 Hx of varicella   . Hypertension     Family History  Problem Relation Age of Onset  . Hyperlipidemia Mother   . Hypertension Mother   . COPD Mother        Deceased  . Hypertension Father   . Cancer - Other Father        Liver deceased  . Hypertension Sister   . Stroke Sister   . Arthritis Sister   . Obstructive Sleep Apnea Sister        And  secondary atrial fibrillation.    Social History   Socioeconomic History  . Marital status: Married    Spouse name: Not on file  . Number of children: Not on file  . Years of education: Not on file  . Highest education level: Not on file  Occupational History  . Not on file  Social Needs  . Financial resource strain: Not on file  . Food insecurity    Worry: Not on file    Inability: Not on file  . Transportation needs    Medical: Not on file    Non-medical: Not on file  Tobacco Use  . Smoking status: Never Smoker  . Smokeless tobacco: Never Used  Substance and Sexual Activity  . Alcohol use: Yes    Alcohol/week: 0.0 standard drinks    Comment: seldom  . Drug use: No  . Sexual activity: Not Currently    Partners: Male    Birth control/protection: Other-see comments    Comment: husband had prostate cancer and BTL   Lifestyle  . Physical activity    Days per week: Not on file    Minutes per session: Not on file  . Stress: Not on file  Relationships  . Social Herbalist on phone: Not on file    Gets together: Not on file    Attends religious service: Not on file    Active member of club or organization: Not on file    Attends meetings of clubs or organizations: Not on file    Relationship status: Not on file  Other Topics Concern  . 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 benazepril-hydrochlorthiazide (LOTENSIN HCT) 20-12.5 MG tablet TAKE 1 TABLET BY MOUTH DAILY.  Marland Kitchen omeprazole (PRILOSEC) 40 MG capsule TAKE 1 CAPSULE 20 MINUTES BEFORE BREAKFAST DAILY   No facility-administered encounter medications on file as of 02/28/2019.     EXAM:  BP 136/82 (BP Location: Right Arm, Patient Position: Sitting, Cuff Size: Normal)   Pulse 66   Temp (!) 97.3 F (36.3 C) (Temporal)   Ht 5' 2.5" (1.588 m)   Wt 217 lb 6.4 oz (98.6 kg)   LMP 06/08/2004   SpO2 98%   BMI 39.13 kg/m   Body mass index is 39.13 kg/m.  Physical Exam: Vital signs reviewed [id number] is a well-developed well-nourished alert cooperative   who appears stated age in no acute distress.  HEENT: normocephalic atraumatic , Eyes: PERRL EOM's full, conjunctiva clear, , Ears: no deformity EAC's clear TMs with normal landmarks. Mouth: masked  NECK: supple without masses, thyromegaly or bruits. CHEST/PULM:  Clear to auscultation and percussion breath sounds equal no wheeze , rales or rhonchi. No chest wall deformities or tenderness.Breast: normal by inspection . No dimpling, discharge, masses, tenderness or discharge . CV: PMI is nondisplaced, S1  S2 no gallops, murmurs, rubs. Peripheral pulses are full without delay.No JVD .  ABDOMEN: Bowel sounds normal nontender  No guard or rebound, no hepato splenomegal no CVA tenderness.   Extremtities:  No clubbing cyanosis or edema, no acute joint swelling or redness no focal atrophy  nummer ous vv but  no ulcreations or edema  NEURO:  Oriented x3, cranial nerves 3-12 appear to be intact, no obvious focal weakness,gait within normal limits no abnormal reflexes or asymmetrical SKIN: No acute rashes normal turgor, color, no bruising or petechiae. PSYCH: Oriented, good eye contact, no obvious depression anxiety, cognition and judgment appear normal. LN: no cervical axillary inguinal adenopathy No noted deficits in memory, attention, and speech.   Lab Results  Component Value Date   WBC 8.0 02/28/2018   HGB 13.3 02/28/2018   HCT 38.4 02/28/2018   PLT 326.0 02/28/2018   GLUCOSE 108 (H) 02/28/2018   CHOL 211 (H) 02/28/2018   TRIG 94.0 02/28/2018   HDL 62.60 02/28/2018   LDLCALC 129 (H) 02/28/2018   ALT 17 02/28/2018   AST 13 02/28/2018   NA 139 02/28/2018   K 3.9 02/28/2018   CL 98 02/28/2018   CREATININE 0.81 02/28/2018   BUN 20 02/28/2018   CO2 33 (H) 02/28/2018   TSH 1.87 02/28/2018   HGBA1C 6.2 02/28/2018   MICROALBUR 0.2 03/23/2014    ASSESSMENT AND PLAN:  Discussed the following assessment and plan:  Visit for preventive health examination  Essential hypertension - Plan: Basic metabolic panel, CBC with Differential/Platelet, Hemoglobin A1c, Hepatic function panel, Lipid panel, TSH, VITAMIN D 25 Hydroxy (Vit-D Deficiency, Fractures)  Medication management - Plan: Basic metabolic panel, CBC with Differential/Platelet, Hemoglobin A1c, Hepatic function panel, Lipid panel, TSH, VITAMIN D 25 Hydroxy (Vit-D Deficiency, Fractures)  Vitamin D deficiency - Plan: Basic metabolic panel, CBC with Differential/Platelet, Hemoglobin A1c, Hepatic function panel, Lipid panel, TSH, VITAMIN D 25  Hydroxy (Vit-D Deficiency, Fractures), VITAMIN D 25 Hydroxy (Vit-D Deficiency, Fractures), DG Bone Density  Hyperglycemia - Plan: Basic metabolic panel, CBC with Differential/Platelet, Hemoglobin A1c, Hepatic function panel, Lipid panel, TSH, VITAMIN D 25 Hydroxy (Vit-D Deficiency, Fractures)  Hyperlipidemia, unspecified hyperlipidemia type - Plan: Basic metabolic panel, CBC with Differential/Platelet, Hemoglobin A1c, Hepatic function panel, Lipid panel, TSH, VITAMIN D 25 Hydroxy (Vit-D Deficiency, Fractures)  Gastroesophageal reflux disease, esophagitis presence not specified - on PPI - Plan: Basic metabolic panel, CBC with Differential/Platelet, Hemoglobin A1c, Hepatic function panel, Lipid panel, TSH, VITAMIN D 25 Hydroxy (Vit-D Deficiency, Fractures)  Estrogen deficiency - Plan: DG Bone Density Disc   Can try qod  ppi risk benefit   Add vit d  Supplement and fu lab  Continue  lsi for cv and bone health etc  .  Order placed for dexa  Breast center   Due for mammo this fall anyway  Return in about 1 year (around 02/28/2020) for preventive /cpx and medications   vit d level in 3-6 mos .  Patient Care Team: Lovette Merta, Standley Brooking, MD as PCP - General (Internal Medicine) Juanita Craver, MD as Attending Physician (Gastroenterology) Jola Schmidt, MD as Consulting Physician (Ophthalmology) Megan Salon, MD as Consulting Physician (Gynecology)  Patient Instructions   Continue lifestyle intervention healthy eating and exercise .  And some modest weight loss can help .   Will notify you  of labs when available.  Take VIt D3 2000 iu per day    Advise we can repeat level in 4-5 mos ( no ov needed)   May be due for a bone density   Vitamin D Deficiency Vitamin D deficiency is when your body does not have enough vitamin D. Vitamin D is important to your body for many reasons:  It helps the body absorb two important minerals-calcium and phosphorus.  It plays a role in bone health.  It may help to  prevent some diseases, such as diabetes and multiple sclerosis.  It plays a role in muscle function, including heart function. If vitamin D deficiency is severe, it can cause a condition in which your bones become soft. In adults, this condition is called osteomalacia. In children, this condition is called rickets. What are the causes? This condition may be caused by:  Not eating enough foods that contain vitamin D.  Not getting enough natural sun exposure.  Having certain digestive system diseases that make it difficult for your body to absorb vitamin D. These diseases include Crohn's disease, chronic pancreatitis, and cystic fibrosis.  Having a surgery in which a part of the stomach or a part of the small intestine is removed.  Having chronic kidney disease or liver disease. What increases the risk? You are more likely to develop this condition if you:  Are older.  Do not spend much time outdoors.  Live in a long-term care facility.  Have had broken bones.  Have weak or thin bones (osteoporosis).  Have a disease or condition that changes how the body absorbs vitamin D.  Have dark skin.  Take certain medicines, such as steroid medicines or certain seizure medicines.  Are overweight or obese. What are the signs or symptoms? In mild cases of vitamin D deficiency, there may not be any symptoms. If the condition is severe, symptoms may include:  Bone pain.  Muscle pain.  Falling often.  Broken bones caused by a minor injury. How is this diagnosed? This condition may be diagnosed with blood tests. Imaging tests such as X-rays may also be done to look for changes in the bone. How is this treated? Treatment for this condition may depend on what caused the condition. Treatment options include:  Taking vitamin D supplements. Your health care provider will suggest what dose is best for you.  Taking a calcium supplement. Your health care provider will suggest what dose is  best for you. Follow these instructions at home: Eating and drinking   Eat foods that contain vitamin D. Choices include: ? Fortified dairy products, cereals, or juices. Fortified means that vitamin D has been added to the food. Check the label on the package to see if the food is fortified. ? Fatty fish, such as salmon or trout. ? Eggs. ? Oysters. ? Mushrooms. The items listed above may not be a complete list of recommended foods and beverages. Contact a dietitian for more information. General instructions  Take medicines and supplements only as told by your health care provider.  Get regular, safe exposure to natural sunlight.  Do not use a tanning bed.  Maintain a healthy weight. Lose weight if needed.  Keep all follow-up visits as told by your health care provider. This is important. How is this prevented? You can get vitamin D by:  Eating foods that naturally contain vitamin D.  Eating or drinking products that have been fortified with vitamin D, such as cereals, juices, and dairy products (including milk).  Taking a vitamin D supplement or a multivitamin supplement that contains vitamin D.  Being in the sun. Your body naturally makes vitamin D when your skin is exposed to sunlight. Your body changes the sunlight into a form of the vitamin that it can use. Contact a health care provider if:  Your symptoms do not go away.  You feel nauseous or you vomit.  You have fewer bowel movements than usual or are constipated. Summary  Vitamin D deficiency  is when your body does not have enough vitamin D.  Vitamin D is important to your body for good bone health and muscle function, and it may help prevent some diseases.  Vitamin D deficiency is primarily treated through supplementation. Your health care provider will suggest what dose is best for you.  You can get vitamin D by eating foods that contain vitamin D, by being in the sun, and by taking a vitamin D supplement or a  multivitamin supplement that contains vitamin D. This information is not intended to replace advice given to you by your health care provider. Make sure you discuss any questions you have with your health care provider. Document Released: 08/17/2011 Document Revised: 01/31/2018 Document Reviewed: 01/31/2018 Elsevier Patient Education  2020 [employer].  .     Health Maintenance, Female Adopting a healthy lifestyle and getting preventive care are important in promoting health and wellness. Ask your health care provider about:  The right schedule for you to have regular tests and exams.  Things you can do on your own to prevent diseases and keep yourself healthy. What should I know about diet, weight, and exercise? Eat a healthy diet   Eat a diet that includes plenty of vegetables, fruits, low-fat dairy products, and lean protein.  Do not eat a lot of foods that are high in solid fats, added sugars, or sodium. Maintain a healthy weight Body mass index (BMI) is used to identify weight problems. It estimates body fat based on height and weight. Your health care provider can help determine your BMI and help you achieve or maintain a healthy weight. Get regular exercise Get regular exercise. This is one of the most important things you can do for your health. Most adults should:  Exercise for at least 150 minutes each week. The exercise should increase your heart rate and make you sweat (moderate-intensity exercise).  Do strengthening exercises at least twice a week. This is in addition to the moderate-intensity exercise.  Spend less time sitting. Even light physical activity can be beneficial. Watch cholesterol and blood lipids Have your blood tested for lipids and cholesterol at 67 years of age, then have this test every 5 years. Have your cholesterol levels checked more often if:  Your lipid or cholesterol levels are high.  You are older than 67 years of age.  You are at high  risk for heart disease. What should I know about cancer screening? Depending on your health history and family history, you may need to have cancer screening at various ages. This may include screening for:  Breast cancer.  Cervical cancer.  Colorectal cancer.  Skin cancer.  Lung cancer. What should I know about heart disease, diabetes, and high blood pressure? Blood pressure and heart disease  High blood pressure causes heart disease and increases the risk of stroke. This is more likely to develop in people who have high blood pressure readings, are of African descent, or are overweight.  Have your blood pressure checked: ? Every 3-5 years if you are 10-3 years of age. ? Every year if you are 63 years old or older. Diabetes Have regular diabetes screenings. This checks your fasting blood sugar level. Have the screening done:  Once every three years after age 18 if you are at a normal weight and have a low risk for diabetes.  More often and at a younger age if you are overweight or have a high risk for diabetes. What should I know about preventing infection?  Hepatitis B If you have a higher risk for hepatitis B, you should be screened for this virus. Talk with your health care provider to find out if you are at risk for hepatitis B infection. Hepatitis C Testing is recommended for:  Everyone born from [birth year] through 1964.  Anyone with known risk factors for hepatitis C. Sexually transmitted infections (STIs)  Get screened for STIs, including gonorrhea and chlamydia, if: ? You are sexually active and are younger than 67 years of age. ? You are older than 67 years of age and your health care provider tells you that you are at risk for this type of infection. ? Your sexual activity has changed since you were last screened, and you are at increased risk for chlamydia or gonorrhea. Ask your health care provider if you are at risk.  Ask your health care provider about whether you  are at high risk for HIV. Your health care provider may recommend a prescription medicine to help prevent HIV infection. If you choose to take medicine to prevent HIV, you should first get tested for HIV. You should then be tested every 3 months for as long as you are taking the medicine. Pregnancy  If you are about to stop having your period (premenopausal) and you may become pregnant, seek counseling before you get pregnant.  Take 400 to 800 micrograms (mcg) of folic acid every day if you become pregnant.  Ask for birth control (contraception) if you want to prevent pregnancy. Osteoporosis and menopause Osteoporosis is a disease in which the bones lose minerals and strength with aging. This can result in bone fractures. If you are 62 years old or older, or if you are at risk for osteoporosis and fractures, ask your health care provider if you should:  Be screened for bone loss.  Take a calcium or vitamin D supplement to lower your risk of fractures.  Be given hormone replacement therapy (HRT) to treat symptoms of menopause. Follow these instructions at home: Lifestyle  Do not use any products that contain nicotine or tobacco, such as cigarettes, e-cigarettes, and chewing tobacco. If you need help quitting, ask your health care provider.  Do not use street drugs.  Do not share needles.  Ask your health care provider for help if you need support or information about quitting drugs. Alcohol use  Do not drink alcohol if: ? Your health care provider tells you not to drink. ? You are pregnant, may be pregnant, or are planning to become pregnant.  If you drink alcohol: ? Limit how much you use to 0-1 drink a day. ? Limit intake if you are breastfeeding.  Be aware of how much alcohol is in your drink. In the [place] one drink equals one 12 oz bottle of beer (355 mL), one 5 oz glass of wine (148 mL), or one 1 oz glass of hard liquor (44 mL). General instructions  Schedule regular  health, dental, and eye exams.  Stay current with your vaccines.  Tell your health care provider if: ? You often feel depressed. ? You have ever been abused or do not feel safe at home. Summary  Adopting a healthy lifestyle and getting preventive care are important in promoting health and wellness.  Follow your health care provider's instructions about healthy diet, exercising, and getting tested or screened for diseases.  Follow your health care provider's instructions on monitoring your cholesterol and blood pressure. This information is not intended to replace advice given to you by your  health care provider. Make sure you discuss any questions you have with your health care provider. Document Released: 12/08/2010 Document Revised: 05/18/2018 Document Reviewed: 05/18/2018 Elsevier Patient Education  2020 Onycha Kayona Foor M.D.

## 2019-02-28 ENCOUNTER — Other Ambulatory Visit: Payer: Self-pay

## 2019-02-28 ENCOUNTER — Ambulatory Visit (INDEPENDENT_AMBULATORY_CARE_PROVIDER_SITE_OTHER): Payer: Medicare HMO | Admitting: Internal Medicine

## 2019-02-28 ENCOUNTER — Encounter: Payer: Self-pay | Admitting: Internal Medicine

## 2019-02-28 VITALS — BP 136/82 | HR 66 | Temp 97.3°F | Ht 62.5 in | Wt 217.4 lb

## 2019-02-28 DIAGNOSIS — R739 Hyperglycemia, unspecified: Secondary | ICD-10-CM

## 2019-02-28 DIAGNOSIS — Z79899 Other long term (current) drug therapy: Secondary | ICD-10-CM | POA: Diagnosis not present

## 2019-02-28 DIAGNOSIS — I10 Essential (primary) hypertension: Secondary | ICD-10-CM | POA: Diagnosis not present

## 2019-02-28 DIAGNOSIS — E559 Vitamin D deficiency, unspecified: Secondary | ICD-10-CM | POA: Diagnosis not present

## 2019-02-28 DIAGNOSIS — Z0000 Encounter for general adult medical examination without abnormal findings: Secondary | ICD-10-CM

## 2019-02-28 DIAGNOSIS — E785 Hyperlipidemia, unspecified: Secondary | ICD-10-CM | POA: Diagnosis not present

## 2019-02-28 DIAGNOSIS — K219 Gastro-esophageal reflux disease without esophagitis: Secondary | ICD-10-CM | POA: Diagnosis not present

## 2019-02-28 DIAGNOSIS — E2839 Other primary ovarian failure: Secondary | ICD-10-CM

## 2019-02-28 LAB — CBC WITH DIFFERENTIAL/PLATELET
Basophils Absolute: 0.1 10*3/uL (ref 0.0–0.1)
Basophils Relative: 0.9 % (ref 0.0–3.0)
Eosinophils Absolute: 0.3 10*3/uL (ref 0.0–0.7)
Eosinophils Relative: 3.2 % (ref 0.0–5.0)
HCT: 36.3 % (ref 36.0–46.0)
Hemoglobin: 12.3 g/dL (ref 12.0–15.0)
Lymphocytes Relative: 21.5 % (ref 12.0–46.0)
Lymphs Abs: 1.7 10*3/uL (ref 0.7–4.0)
MCHC: 34 g/dL (ref 30.0–36.0)
MCV: 87.4 fl (ref 78.0–100.0)
Monocytes Absolute: 0.4 10*3/uL (ref 0.1–1.0)
Monocytes Relative: 5.3 % (ref 3.0–12.0)
Neutro Abs: 5.5 10*3/uL (ref 1.4–7.7)
Neutrophils Relative %: 69.1 % (ref 43.0–77.0)
Platelets: 298 10*3/uL (ref 150.0–400.0)
RBC: 4.16 Mil/uL (ref 3.87–5.11)
RDW: 13.4 % (ref 11.5–15.5)
WBC: 8 10*3/uL (ref 4.0–10.5)

## 2019-02-28 LAB — LIPID PANEL
Cholesterol: 198 mg/dL (ref 0–200)
HDL: 69.2 mg/dL (ref >39.00)
LDL Cholesterol: 115 mg/dL — ABNORMAL HIGH (ref 0–99)
NonHDL: 128.47
Total CHOL/HDL Ratio: 3
Triglycerides: 68 mg/dL (ref 0.0–149.0)
VLDL: 13.6 mg/dL (ref 0.0–40.0)

## 2019-02-28 LAB — BASIC METABOLIC PANEL
BUN: 23 mg/dL (ref 6–23)
CO2: 33 mEq/L — ABNORMAL HIGH (ref 19–32)
Calcium: 9.4 mg/dL (ref 8.4–10.5)
Chloride: 101 mEq/L (ref 96–112)
Creatinine, Ser: 0.93 mg/dL (ref 0.40–1.20)
GFR: 60.08 mL/min (ref >60.00)
Glucose, Bld: 100 mg/dL — ABNORMAL HIGH (ref 70–99)
Potassium: 4.3 mEq/L (ref 3.5–5.1)
Sodium: 140 mEq/L (ref 135–145)

## 2019-02-28 LAB — HEPATIC FUNCTION PANEL
ALT: 19 U/L (ref 0–35)
AST: 16 U/L (ref 0–37)
Albumin: 3.9 g/dL (ref 3.5–5.2)
Alkaline Phosphatase: 79 U/L (ref 39–117)
Bilirubin, Direct: 0.1 mg/dL (ref 0.0–0.3)
Total Bilirubin: 0.5 mg/dL (ref 0.2–1.2)
Total Protein: 6.7 g/dL (ref 6.0–8.3)

## 2019-02-28 LAB — VITAMIN D 25 HYDROXY (VIT D DEFICIENCY, FRACTURES): VITD: 12.04 ng/mL — ABNORMAL LOW (ref 30.00–100.00)

## 2019-02-28 LAB — TSH: TSH: 1.49 u[IU]/mL (ref 0.35–4.50)

## 2019-02-28 LAB — HEMOGLOBIN A1C: Hgb A1c MFr Bld: 6.3 % (ref 4.6–6.5)

## 2019-02-28 NOTE — Patient Instructions (Signed)
Continue lifestyle intervention healthy eating and exercise .  And some modest weight loss can help .   Will notify you  of labs when available.  Take VIt D3 2000 iu per day    Advise we can repeat level in 4-5 mos ( no ov needed)   May be due for a bone density   Vitamin D Deficiency Vitamin D deficiency is when your body does not have enough vitamin D. Vitamin D is important to your body for many reasons:  It helps the body absorb two important minerals-calcium and phosphorus.  It plays a role in bone health.  It may help to prevent some diseases, such as diabetes and multiple sclerosis.  It plays a role in muscle function, including heart function. If vitamin D deficiency is severe, it can cause a condition in which your bones become soft. In adults, this condition is called osteomalacia. In children, this condition is called rickets. What are the causes? This condition may be caused by:  Not eating enough foods that contain vitamin D.  Not getting enough natural sun exposure.  Having certain digestive system diseases that make it difficult for your body to absorb vitamin D. These diseases include Crohn's disease, chronic pancreatitis, and cystic fibrosis.  Having a surgery in which a part of the stomach or a part of the small intestine is removed.  Having chronic kidney disease or liver disease. What increases the risk? You are more likely to develop this condition if you:  Are older.  Do not spend much time outdoors.  Live in a long-term care facility.  Have had broken bones.  Have weak or thin bones (osteoporosis).  Have a disease or condition that changes how the body absorbs vitamin D.  Have dark skin.  Take certain medicines, such as steroid medicines or certain seizure medicines.  Are overweight or obese. What are the signs or symptoms? In mild cases of vitamin D deficiency, there may not be any symptoms. If the condition is severe, symptoms may  include:  Bone pain.  Muscle pain.  Falling often.  Broken bones caused by a minor injury. How is this diagnosed? This condition may be diagnosed with blood tests. Imaging tests such as X-rays may also be done to look for changes in the bone. How is this treated? Treatment for this condition may depend on what caused the condition. Treatment options include:  Taking vitamin D supplements. Your health care provider will suggest what dose is best for you.  Taking a calcium supplement. Your health care provider will suggest what dose is best for you. Follow these instructions at home: Eating and drinking   Eat foods that contain vitamin D. Choices include: ? Fortified dairy products, cereals, or juices. Fortified means that vitamin D has been added to the food. Check the label on the package to see if the food is fortified. ? Fatty fish, such as salmon or trout. ? Eggs. ? Oysters. ? Mushrooms. The items listed above may not be a complete list of recommended foods and beverages. Contact a dietitian for more information. General instructions  Take medicines and supplements only as told by your health care provider.  Get regular, safe exposure to natural sunlight.  Do not use a tanning bed.  Maintain a healthy weight. Lose weight if needed.  Keep all follow-up visits as told by your health care provider. This is important. How is this prevented? You can get vitamin D by:  Eating foods that naturally contain  vitamin D.  Eating or drinking products that have been fortified with vitamin D, such as cereals, juices, and dairy products (including milk).  Taking a vitamin D supplement or a multivitamin supplement that contains vitamin D.  Being in the sun. Your body naturally makes vitamin D when your skin is exposed to sunlight. Your body changes the sunlight into a form of the vitamin that it can use. Contact a health care provider if:  Your symptoms do not go away.  You  feel nauseous or you vomit.  You have fewer bowel movements than usual or are constipated. Summary  Vitamin D deficiency is when your body does not have enough vitamin D.  Vitamin D is important to your body for good bone health and muscle function, and it may help prevent some diseases.  Vitamin D deficiency is primarily treated through supplementation. Your health care provider will suggest what dose is best for you.  You can get vitamin D by eating foods that contain vitamin D, by being in the sun, and by taking a vitamin D supplement or a multivitamin supplement that contains vitamin D. This information is not intended to replace advice given to you by your health care provider. Make sure you discuss any questions you have with your health care provider. Document Released: 08/17/2011 Document Revised: 01/31/2018 Document Reviewed: 01/31/2018 Elsevier Patient Education  2020 [employer].  .     Health Maintenance, Female Adopting a healthy lifestyle and getting preventive care are important in promoting health and wellness. Ask your health care provider about:  The right schedule for you to have regular tests and exams.  Things you can do on your own to prevent diseases and keep yourself healthy. What should I know about diet, weight, and exercise? Eat a healthy diet   Eat a diet that includes plenty of vegetables, fruits, low-fat dairy products, and lean protein.  Do not eat a lot of foods that are high in solid fats, added sugars, or sodium. Maintain a healthy weight Body mass index (BMI) is used to identify weight problems. It estimates body fat based on height and weight. Your health care provider can help determine your BMI and help you achieve or maintain a healthy weight. Get regular exercise Get regular exercise. This is one of the most important things you can do for your health. Most adults should:  Exercise for at least 150 minutes each week. The exercise should  increase your heart rate and make you sweat (moderate-intensity exercise).  Do strengthening exercises at least twice a week. This is in addition to the moderate-intensity exercise.  Spend less time sitting. Even light physical activity can be beneficial. Watch cholesterol and blood lipids Have your blood tested for lipids and cholesterol at 67 years of age, then have this test every 5 years. Have your cholesterol levels checked more often if:  Your lipid or cholesterol levels are high.  You are older than 67 years of age.  You are at high risk for heart disease. What should I know about cancer screening? Depending on your health history and family history, you may need to have cancer screening at various ages. This may include screening for:  Breast cancer.  Cervical cancer.  Colorectal cancer.  Skin cancer.  Lung cancer. What should I know about heart disease, diabetes, and high blood pressure? Blood pressure and heart disease  High blood pressure causes heart disease and increases the risk of stroke. This is more likely to develop in  people who have high blood pressure readings, are of African descent, or are overweight.  Have your blood pressure checked: ? Every 3-5 years if you are 74-11 years of age. ? Every year if you are 9 years old or older. Diabetes Have regular diabetes screenings. This checks your fasting blood sugar level. Have the screening done:  Once every three years after age 30 if you are at a normal weight and have a low risk for diabetes.  More often and at a younger age if you are overweight or have a high risk for diabetes. What should I know about preventing infection? Hepatitis B If you have a higher risk for hepatitis B, you should be screened for this virus. Talk with your health care provider to find out if you are at risk for hepatitis B infection. Hepatitis C Testing is recommended for:  Everyone born from [birth year] through 1964.  Anyone with  known risk factors for hepatitis C. Sexually transmitted infections (STIs)  Get screened for STIs, including gonorrhea and chlamydia, if: ? You are sexually active and are younger than 67 years of age. ? You are older than 67 years of age and your health care provider tells you that you are at risk for this type of infection. ? Your sexual activity has changed since you were last screened, and you are at increased risk for chlamydia or gonorrhea. Ask your health care provider if you are at risk.  Ask your health care provider about whether you are at high risk for HIV. Your health care provider may recommend a prescription medicine to help prevent HIV infection. If you choose to take medicine to prevent HIV, you should first get tested for HIV. You should then be tested every 3 months for as long as you are taking the medicine. Pregnancy  If you are about to stop having your period (premenopausal) and you may become pregnant, seek counseling before you get pregnant.  Take 400 to 800 micrograms (mcg) of folic acid every day if you become pregnant.  Ask for birth control (contraception) if you want to prevent pregnancy. Osteoporosis and menopause Osteoporosis is a disease in which the bones lose minerals and strength with aging. This can result in bone fractures. If you are 55 years old or older, or if you are at risk for osteoporosis and fractures, ask your health care provider if you should:  Be screened for bone loss.  Take a calcium or vitamin D supplement to lower your risk of fractures.  Be given hormone replacement therapy (HRT) to treat symptoms of menopause. Follow these instructions at home: Lifestyle  Do not use any products that contain nicotine or tobacco, such as cigarettes, e-cigarettes, and chewing tobacco. If you need help quitting, ask your health care provider.  Do not use street drugs.  Do not share needles.  Ask your health care provider for help if you need  support or information about quitting drugs. Alcohol use  Do not drink alcohol if: ? Your health care provider tells you not to drink. ? You are pregnant, may be pregnant, or are planning to become pregnant.  If you drink alcohol: ? Limit how much you use to 0-1 drink a day. ? Limit intake if you are breastfeeding.  Be aware of how much alcohol is in your drink. In the [place] one drink equals one 12 oz bottle of beer (355 mL), one 5 oz glass of wine (148 mL), or one 1 oz glass of hard  liquor (44 mL). General instructions  Schedule regular health, dental, and eye exams.  Stay current with your vaccines.  Tell your health care provider if: ? You often feel depressed. ? You have ever been abused or do not feel safe at home. Summary  Adopting a healthy lifestyle and getting preventive care are important in promoting health and wellness.  Follow your health care provider's instructions about healthy diet, exercising, and getting tested or screened for diseases.  Follow your health care provider's instructions on monitoring your cholesterol and blood pressure. This information is not intended to replace advice given to you by your health care provider. Make sure you discuss any questions you have with your health care provider. Document Released: 12/08/2010 Document Revised: 05/18/2018 Document Reviewed: 05/18/2018 Elsevier Patient Education  2020 [employer].

## 2019-03-23 ENCOUNTER — Other Ambulatory Visit: Payer: Self-pay | Admitting: Internal Medicine

## 2019-03-23 DIAGNOSIS — Z1231 Encounter for screening mammogram for malignant neoplasm of breast: Secondary | ICD-10-CM

## 2019-06-12 ENCOUNTER — Other Ambulatory Visit: Payer: Self-pay

## 2019-06-12 ENCOUNTER — Ambulatory Visit
Admission: RE | Admit: 2019-06-12 | Discharge: 2019-06-12 | Disposition: A | Discharge status: Home / Self Care | Payer: Medicare HMO | Source: Ambulatory Visit | Attending: Internal Medicine | Admitting: Internal Medicine

## 2019-06-12 DIAGNOSIS — Z1231 Encounter for screening mammogram for malignant neoplasm of breast: Secondary | ICD-10-CM | POA: Diagnosis not present

## 2019-06-12 DIAGNOSIS — E2839 Other primary ovarian failure: Secondary | ICD-10-CM

## 2019-06-12 DIAGNOSIS — Z780 Asymptomatic menopausal state: Secondary | ICD-10-CM | POA: Diagnosis not present

## 2019-06-12 DIAGNOSIS — E559 Vitamin D deficiency, unspecified: Secondary | ICD-10-CM

## 2019-06-29 ENCOUNTER — Ambulatory Visit: Payer: Medicare HMO | Attending: Internal Medicine

## 2019-06-29 DIAGNOSIS — Z23 Encounter for immunization: Secondary | ICD-10-CM | POA: Insufficient documentation

## 2019-06-29 NOTE — Progress Notes (Signed)
   Covid-19 Vaccination Clinic  Name:  [name]    MRN: [id number] DOB: April 13, 1951  06/29/2019  [name] was observed post Covid-19 immunization for 15 minutes without incidence. She was provided with Vaccine Information Sheet and instruction to access the V-Safe system.   [name] was instructed to call 911 with any severe reactions post vaccine: Marland Kitchen Difficulty breathing  . Swelling of your face and throat  . A fast heartbeat  . A bad rash all over your body  . Dizziness and weakness    Immunizations Administered    Name Date Dose VIS Date Route   Pfizer COVID-19 Vaccine 06/29/2019  1:05 PM 0.3 mL 05/19/2019 Intramuscular   Manufacturer: [place]   Lot: [id number]   [place]: [id number]

## 2019-07-07 ENCOUNTER — Other Ambulatory Visit: Payer: Self-pay | Admitting: Internal Medicine

## 2019-07-17 ENCOUNTER — Ambulatory Visit: Payer: Medicare HMO | Attending: Internal Medicine

## 2019-07-17 DIAGNOSIS — Z23 Encounter for immunization: Secondary | ICD-10-CM | POA: Insufficient documentation

## 2019-07-17 NOTE — Progress Notes (Signed)
   Covid-19 Vaccination Clinic  Name:  [name]    MRN: [id number] DOB: February 08, 1951  07/17/2019  [name] was observed post Covid-19 immunization for 15 minutes without incidence. She was provided with Vaccine Information Sheet and instruction to access the V-Safe system.   [name] was instructed to call 911 with any severe reactions post vaccine: Marland Kitchen Difficulty breathing  . Swelling of your face and throat  . A fast heartbeat  . A bad rash all over your body  . Dizziness and weakness    Immunizations Administered    Name Date Dose VIS Date Route   Pfizer COVID-19 Vaccine 07/17/2019 12:15 PM 0.3 mL 05/19/2019 Intramuscular   Manufacturer: [place]   Lot: [id number]   [place]: [id number]

## 2019-08-10 DIAGNOSIS — R69 Illness, unspecified: Secondary | ICD-10-CM | POA: Diagnosis not present

## 2019-09-26 DIAGNOSIS — M7501 Adhesive capsulitis of right shoulder: Secondary | ICD-10-CM | POA: Diagnosis not present

## 2019-09-26 DIAGNOSIS — M19011 Primary osteoarthritis, right shoulder: Secondary | ICD-10-CM | POA: Diagnosis not present

## 2019-09-26 DIAGNOSIS — M25511 Pain in right shoulder: Secondary | ICD-10-CM | POA: Diagnosis not present

## 2019-10-31 DIAGNOSIS — K219 Gastro-esophageal reflux disease without esophagitis: Secondary | ICD-10-CM | POA: Diagnosis not present

## 2019-10-31 DIAGNOSIS — K5730 Diverticulosis of large intestine without perforation or abscess without bleeding: Secondary | ICD-10-CM | POA: Diagnosis not present

## 2019-10-31 DIAGNOSIS — K5901 Slow transit constipation: Secondary | ICD-10-CM | POA: Diagnosis not present

## 2020-01-01 ENCOUNTER — Other Ambulatory Visit: Payer: Self-pay | Admitting: Internal Medicine

## 2020-01-24 ENCOUNTER — Ambulatory Visit (INDEPENDENT_AMBULATORY_CARE_PROVIDER_SITE_OTHER): Payer: Medicare HMO

## 2020-01-24 ENCOUNTER — Other Ambulatory Visit: Payer: Self-pay

## 2020-01-24 DIAGNOSIS — Z0000 Encounter for general adult medical examination without abnormal findings: Secondary | ICD-10-CM | POA: Diagnosis not present

## 2020-01-24 NOTE — Progress Notes (Signed)
Virtual Visit via Telephone Note  I connected with  [name] on 01/24/20 at  9:30 AM EDT by telephone and verified that I am speaking with the correct person using two identifiers.  Medicare Annual Wellness visit completed telephonically due to Covid-19 pandemic.   Persons participating in this call: This Health Coach and this patient.   Location: Patient: Home Provider: Office   I discussed the limitations, risks, security and privacy concerns of performing an evaluation and management service by telephone and the availability of in person appointments. The patient expressed understanding and agreed to proceed.  Unable to perform video visit due to video visit attempted and failed and/or patient does not have video capability.   Some vital signs may be absent or patient reported.   Willette Brace, LPN    Subjective:   [name] is a 69 y.o. female who presents for an Initial Medicare Annual Wellness Visit.  Review of Systems     Cardiac Risk Factors include: advanced age (>42men, >51 women);hypertension;obesity (BMI >30kg/m2)     Objective:    There were no vitals filed for this visit. There is no height or weight on file to calculate BMI.  Advanced Directives 01/24/2020 10/23/2013  Does Patient Have a Medical Advance Directive? No Patient does not have advance directive  Would patient like information on creating a medical advance directive? Yes (MAU/Ambulatory/Procedural Areas - Information given) -    Current Medications (verified) Outpatient Encounter Medications as of 01/24/2020  Medication Sig  . benazepril-hydrochlorthiazide (LOTENSIN HCT) 20-12.5 MG tablet TAKE 1 TABLET BY MOUTH DAILY.  . cholecalciferol (VITAMIN D3) 25 MCG (1000 UNIT) tablet Take 2,000 Units by mouth daily.  . famotidine (PEPCID) 40 MG tablet Take 40 mg by mouth 2 (two) times daily.  Marland Kitchen omeprazole (PRILOSEC) 40 MG capsule TAKE 1 CAPSULE 20 MINUTES BEFORE BREAKFAST DAILY   No  facility-administered encounter medications on file as of 01/24/2020.    Allergies (verified) Neosporin eczema essentials [colloidal oatmeal]   History: Past Medical History:  Diagnosis Date  . Diverticulitis   . GERD (gastroesophageal reflux disease)   . 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 Hx of varicella   . Hypertension    Past Surgical History:  Procedure Laterality Date  . CESAREAN SECTION     2  . CHOLECYSTECTOMY  2005  . TUBAL LIGATION     BTL   Family History  Problem Relation Age of Onset  . Hyperlipidemia Mother   . Hypertension Mother   . COPD Mother        Deceased  . Hypertension Father   . Cancer - Other Father        Liver deceased  . Hypertension Sister   . Stroke Sister   . Arthritis Sister   . Obstructive Sleep Apnea Sister        And secondary atrial fibrillation.   Social History   Socioeconomic History  . Marital status: Married    Spouse name: Not on file  . Number of children: Not on file  . Years of education: Not on file  . Highest education level: Not on file  Occupational History  . Occupation: Retired  Tobacco Use  . Smoking status: Never Smoker  . Smokeless tobacco: Never Used  Vaping Use  . Vaping Use: Never used  Substance and Sexual Activity  . Alcohol use: Yes    Alcohol/week: 0.0 standard drinks  Comment: seldom  . Drug use: No  . Sexual activity: Not Currently    Partners: Male    Birth control/protection: Other-see comments    Comment: husband had prostate cancer and BTL  Other Topics Concern  . 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 Difficulty of Paying Living  Expenses: Not hard at all  Food Insecurity: No Food Insecurity  . Worried About Charity fundraiser in the Last Year: Never true  . Ran Out of Food in the Last Year: Never true  Transportation Needs: No Transportation Needs  . Lack of Transportation (Medical): No  . Lack of Transportation (Non-Medical): No  Physical Activity: Sufficiently Active  . Days of Exercise per Week: 4 days  . Minutes of Exercise per Session: 120 min  Stress: No Stress Concern Present  . Feeling of Stress : Not at all  Social Connections: Moderately Integrated  . Frequency of Communication with Friends and Family: More than three times a week  . Frequency of Social Gatherings with Friends and Family: More than three times a week  . Attends Religious Services: 1 to 4 times per year  . Active Member of Clubs or Organizations: No  . Attends Archivist Meetings: Never  . Marital Status: Married    Tobacco Counseling Counseling given: Not Answered   Clinical Intake:  Pre-visit preparation completed: Yes  Pain : No/denies pain     BMI - recorded: 39.13 Nutritional Status: BMI > 30  Obese Nutritional Risks: None Diabetes: No  How often do you need to have someone help you when you read instructions, pamphlets, or other written materials from your doctor or pharmacy?: 1 - Never  Diabetic?No  Interpreter Needed?: No  Information entered by :: Charlott Rakes, LPN   Activities of Daily Living In your present state of health, do you have any difficulty performing the following activities: 01/24/2020  Hearing? N  Vision? N  Difficulty concentrating or making decisions? N  Walking or climbing stairs? N  Dressing or bathing? N  Doing errands, shopping? N  Preparing Food and eating ? N  Using the Toilet? N  In the past six months, have you accidently leaked urine? Y  Comment with sneezing at times  Do you have problems with loss of bowel control? N  Managing your Medications? N  Managing  your Finances? N  Housekeeping or managing your Housekeeping? N  Some recent data might be hidden    Patient Care Team: Panosh, Standley Brooking, MD as PCP - General (Internal Medicine) Juanita Craver, MD as Attending Physician (Gastroenterology) Jola Schmidt, MD as Consulting Physician (Ophthalmology) Megan Salon, MD as Consulting Physician (Gynecology)  Indicate any recent Medical Services you may have received from other than [facility] providers in the past year (date may be approximate).     Assessment:   This is a routine wellness examination for [facility].  Hearing/Vision screen  Hearing Screening   125Hz  250Hz  500Hz  1000Hz  2000Hz  3000Hz  4000Hz  6000Hz  8000Hz   Right ear:           Left ear:  Comments: Pt  denies any difficulty at this time  Vision Screening Comments: Pt has appt Friday with Dr Roxy Manns at [facility] op[facility]   Dietary issues and exercise activities discussed: Current Exercise Habits: Home exercise routine, Type of exercise: walking, Time (Minutes): 60, Frequency (Times/Week): 3, Weekly Exercise (Minutes/Week): 180  Goals    . lose weight     Lose weight      Depression Screen PHQ 2/9 Scores 01/24/2020 02/28/2019 02/28/2018 01/26/2017 08/29/2013  PHQ - 2 Score 0 0 0 0 0    Fall Risk Fall Risk  01/24/2020 02/28/2018 01/26/2017 08/29/2013  Falls in the past year? 0 No No No  Number falls in past yr: 0 - - -  Injury with Fall? 0 - - -  Risk for fall due to : Impaired vision - - -    Any stairs in or around the home? Yes  If so, are there any without handrails? Yes  Home free of loose throw rugs in walkways, pet beds, electrical cords, etc? Yes  Adequate lighting in your home to reduce risk of falls? Yes   ASSISTIVE DEVICES UTILIZED TO PREVENT FALLS:  Life alert? No  Use of a cane, walker or w/c? No  Grab bars in the bathroom? Yes  Shower chair or bench in shower? Yes  Elevated toilet seat or a handicapped toilet? No   TIMED UP AND GO:  Was the test  performed? No .    Cognitive Function:     6CIT Screen 01/24/2020  What Year? 0 points  What month? 0 points  Count back from 20 0 points  Months in reverse 0 points  Repeat phrase 0 points    Immunizations Immunization History  Administered Date(s) Administered  . Influenza Split 03/08/2013  . Influenza, High Dose Seasonal PF 02/02/2019  . Influenza-Unspecified 03/30/2014  . PFIZER SARS-COV-2 Vaccination 06/29/2019, 07/17/2019  . Pneumococcal Conjugate-13 01/26/2017  . Pneumococcal Polysaccharide-23 02/28/2018  . Tdap 06/09/2007  . Zoster Recombinat (Shingrix) 11/25/2017, 08/13/2018    TDAP status: Due, Education has been provided regarding the importance of this vaccine. Advised may receive this vaccine at local pharmacy or Health Dept. Aware to provide a copy of the vaccination record if obtained from local pharmacy or Health Dept. Verbalized acceptance and understanding. Flu Vaccine status: Up to date Pneumococcal vaccine status: Up to date Covid-19 vaccine status: Completed vaccines  Qualifies for Shingles Vaccine? Yes   Zostavax completed No   Shingrix Completed?: Yes  Screening Tests Health Maintenance  Topic Date Due  . TETANUS/TDAP  06/08/2017  . HEMOGLOBIN A1C  08/28/2019  . INFLUENZA VACCINE  01/07/2020  . MAMMOGRAM  06/11/2021  . COLONOSCOPY  11/17/2026  . DEXA SCAN  Completed  . COVID-19 Vaccine  Completed  . Hepatitis C Screening  Completed  . PNA vac Low Risk Adult  Completed    Health Maintenance  Health Maintenance Due  Topic Date Due  . TETANUS/TDAP  06/08/2017  . HEMOGLOBIN A1C  08/28/2019  . INFLUENZA VACCINE  01/07/2020    Colorectal cancer screening: Completed 11/16/16. Repeat every 10 years Mammogram status: Completed 06/12/19. Repeat every year Bone Density status: Completed 06/12/19. Results reflect: Bone density results: OSTEOPOROSIS. Repeat every 2 years.    Additional Screening:  Hepatitis C Screening: Completed  02/28/18  Vision Screening: Recommended annual ophthalmology exams for early detection of glaucoma and other disorders of the eye. Is the patient up to date with their annual eye exam?  Yes  Who is the provider  or what is the name of the office in which the patient attends annual eye exams? Dr Valetta Close   Dental Screening: Recommended annual dental exams for proper oral hygiene  Community Resource Referral / Chronic Care Management: CRR required this visit?  No   CCM required this visit?  No      Plan:     I have personally reviewed and noted the following in the patient's chart:   . Medical and social history . Use of alcohol, tobacco or illicit drugs  . Current medications and supplements . Functional ability and status . Nutritional status . Physical activity . Advanced directives . List of other physicians . Hospitalizations, surgeries, and ER visits in previous 12 months . Vitals . Screenings to include cognitive, depression, and falls . Referrals and appointments  In addition, I have reviewed and discussed with patient certain preventive protocols, quality metrics, and best practice recommendations. A written personalized care plan for preventive services as well as general preventive health recommendations were provided to patient.     Willette Brace, LPN   [id number]   Nurse Notes: None

## 2020-01-24 NOTE — Patient Instructions (Addendum)
[name] , Thank you for taking time to come for your Medicare Wellness Visit. I appreciate your ongoing commitment to your health goals. Please review the following plan we discussed and let me know if I can assist you in the future.   Screening recommendations/referrals: Colonoscopy: Done 11/16/16 Mammogram: Done 06/12/19 Bone Density: Done 06/12/19 Recommended yearly ophthalmology/optometry visit for glaucoma screening and checkup Recommended yearly dental visit for hygiene and checkup  Vaccinations: Influenza vaccine: Up to date Pneumococcal vaccine: Up to date Tdap vaccine: Due and discussed Shingles vaccine: Completed 11/25/17 & 08/13/18   Covid-19:Completed 1/2 & 07/17/19  Advanced directives: Advance directive discussed with you today. I have provided a copy for you to complete at home and have notarized. Once this is complete please bring a copy in to our office so we can scan it into your chart.  Conditions/risks identified: Lose weight  Next appointment: Follow up in one year for your annual wellness visit    Preventive Care 65 Years and Older, Female Preventive care refers to lifestyle choices and visits with your health care provider that can promote health and wellness. What does preventive care include?  A yearly physical exam. This is also called an annual well check.  Dental exams once or twice a year.  Routine eye exams. Ask your health care provider how often you should have your eyes checked.  Personal lifestyle choices, including:  Daily care of your teeth and gums.  Regular physical activity.  Eating a healthy diet.  Avoiding tobacco and drug use.  Limiting alcohol use.  Practicing safe sex.  Taking low-dose aspirin every day.  Taking vitamin and mineral supplements as recommended by your health care provider. What happens during an annual well check? The services and screenings done by your health care provider during your annual well check will  depend on your age, overall health, lifestyle risk factors, and family history of disease. Counseling  Your health care provider may ask you questions about your:  Alcohol use.  Tobacco use.  Drug use.  Emotional well-being.  Home and relationship well-being.  Sexual activity.  Eating habits.  History of falls.  Memory and ability to understand (cognition).  Work and work Statistician.  Reproductive health. Screening  You may have the following tests or measurements:  Height, weight, and BMI.  Blood pressure.  Lipid and cholesterol levels. These may be checked every 5 years, or more frequently if you are over 35 years old.  Skin check.  Lung cancer screening. You may have this screening every year starting at age 74 if you have a 30-pack-year history of smoking and currently smoke or have quit within the past 15 years.  Fecal occult blood test (FOBT) of the stool. You may have this test every year starting at age 38.  Flexible sigmoidoscopy or colonoscopy. You may have a sigmoidoscopy every 5 years or a colonoscopy every 10 years starting at age 51.  Hepatitis C blood test.  Hepatitis B blood test.  Sexually transmitted disease (STD) testing.  Diabetes screening. This is done by checking your blood sugar (glucose) after you have not eaten for a while (fasting). You may have this done every 1-3 years.  Bone density scan. This is done to screen for osteoporosis. You may have this done starting at age 44.  Mammogram. This may be done every 1-2 years. Talk to your health care provider about how often you should have regular mammograms. Talk with your health care provider about your test  results, treatment options, and if necessary, the need for more tests. Vaccines  Your health care provider may recommend certain vaccines, such as:  Influenza vaccine. This is recommended every year.  Tetanus, diphtheria, and acellular pertussis (Tdap, Td) vaccine. You may need a  Td booster every 10 years.  Zoster vaccine. You may need this after age 60.  Pneumococcal 13-valent conjugate (PCV13) vaccine. One dose is recommended after age 90.  Pneumococcal polysaccharide (PPSV23) vaccine. One dose is recommended after age 56. Talk to your health care provider about which screenings and vaccines you need and how often you need them. This information is not intended to replace advice given to you by your health care provider. Make sure you discuss any questions you have with your health care provider. Document Released: 06/21/2015 Document Revised: 02/12/2016 Document Reviewed: 03/26/2015 Elsevier Interactive Patient Education  2017 [employer] Prevention in the Home Falls can cause injuries. They can happen to people of all ages. There are many things you can do to make your home safe and to help prevent falls. What can I do on the outside of my home?  Regularly fix the edges of walkways and driveways and fix any cracks.  Remove anything that might make you trip as you walk through a door, such as a raised step or threshold.  Trim any bushes or trees on the path to your home.  Use bright outdoor lighting.  Clear any walking paths of anything that might make someone trip, such as rocks or tools.  Regularly check to see if handrails are loose or broken. Make sure that both sides of any steps have handrails.  Any raised decks and porches should have guardrails on the edges.  Have any leaves, snow, or ice cleared regularly.  Use sand or salt on walking paths during winter.  Clean up any spills in your garage right away. This includes oil or grease spills. What can I do in the bathroom?  Use night lights.  Install grab bars by the toilet and in the tub and shower. Do not use towel bars as grab bars.  Use non-skid mats or decals in the tub or shower.  If you need to sit down in the shower, use a plastic, non-slip stool.  Keep the floor dry. Clean  up any water that spills on the floor as soon as it happens.  Remove soap buildup in the tub or shower regularly.  Attach bath mats securely with double-sided non-slip rug tape.  Do not have throw rugs and other things on the floor that can make you trip. What can I do in the bedroom?  Use night lights.  Make sure that you have a light by your bed that is easy to reach.  Do not use any sheets or blankets that are too big for your bed. They should not hang down onto the floor.  Have a firm chair that has side arms. You can use this for support while you get dressed.  Do not have throw rugs and other things on the floor that can make you trip. What can I do in the kitchen?  Clean up any spills right away.  Avoid walking on wet floors.  Keep items that you use a lot in easy-to-reach places.  If you need to reach something above you, use a strong step stool that has a grab bar.  Keep electrical cords out of the way.  Do not use floor polish or wax that makes  floors slippery. If you must use wax, use non-skid floor wax.  Do not have throw rugs and other things on the floor that can make you trip. What can I do with my stairs?  Do not leave any items on the stairs.  Make sure that there are handrails on both sides of the stairs and use them. Fix handrails that are broken or loose. Make sure that handrails are as long as the stairways.  Check any carpeting to make sure that it is firmly attached to the stairs. Fix any carpet that is loose or worn.  Avoid having throw rugs at the top or bottom of the stairs. If you do have throw rugs, attach them to the floor with carpet tape.  Make sure that you have a light switch at the top of the stairs and the bottom of the stairs. If you do not have them, ask someone to add them for you. What else can I do to help prevent falls?  Wear shoes that:  Do not have high heels.  Have rubber bottoms.  Are comfortable and fit you well.  Are  closed at the toe. Do not wear sandals.  If you use a stepladder:  Make sure that it is fully opened. Do not climb a closed stepladder.  Make sure that both sides of the stepladder are locked into place.  Ask someone to hold it for you, if possible.  Clearly mark and make sure that you can see:  Any grab bars or handrails.  First and last steps.  Where the edge of each step is.  Use tools that help you move around (mobility aids) if they are needed. These include:  Canes.  Walkers.  Scooters.  Crutches.  Turn on the lights when you go into a dark area. Replace any light bulbs as soon as they burn out.  Set up your furniture so you have a clear path. Avoid moving your furniture around.  If any of your floors are uneven, fix them.  If there are any pets around you, be aware of where they are.  Review your medicines with your doctor. Some medicines can make you feel dizzy. This can increase your chance of falling. Ask your doctor what other things that you can do to help prevent falls. This information is not intended to replace advice given to you by your health care provider. Make sure you discuss any questions you have with your health care provider. Document Released: 03/21/2009 Document Revised: 10/31/2015 Document Reviewed: 06/29/2014 Elsevier Interactive Patient Education  2017 [employer].

## 2020-01-26 DIAGNOSIS — H5203 Hypermetropia, bilateral: Secondary | ICD-10-CM | POA: Diagnosis not present

## 2020-01-26 DIAGNOSIS — H2513 Age-related nuclear cataract, bilateral: Secondary | ICD-10-CM | POA: Diagnosis not present

## 2020-03-01 ENCOUNTER — Ambulatory Visit (INDEPENDENT_AMBULATORY_CARE_PROVIDER_SITE_OTHER): Payer: Medicare HMO | Admitting: Internal Medicine

## 2020-03-01 ENCOUNTER — Other Ambulatory Visit: Payer: Self-pay

## 2020-03-01 ENCOUNTER — Encounter: Payer: Self-pay | Admitting: Internal Medicine

## 2020-03-01 VITALS — BP 124/72 | HR 67 | Temp 98.0°F | Ht 62.75 in | Wt 216.8 lb

## 2020-03-01 DIAGNOSIS — Z0000 Encounter for general adult medical examination without abnormal findings: Secondary | ICD-10-CM | POA: Diagnosis not present

## 2020-03-01 DIAGNOSIS — E785 Hyperlipidemia, unspecified: Secondary | ICD-10-CM | POA: Diagnosis not present

## 2020-03-01 DIAGNOSIS — R739 Hyperglycemia, unspecified: Secondary | ICD-10-CM

## 2020-03-01 DIAGNOSIS — Z79899 Other long term (current) drug therapy: Secondary | ICD-10-CM

## 2020-03-01 DIAGNOSIS — Z23 Encounter for immunization: Secondary | ICD-10-CM

## 2020-03-01 DIAGNOSIS — I10 Essential (primary) hypertension: Secondary | ICD-10-CM | POA: Diagnosis not present

## 2020-03-01 DIAGNOSIS — E559 Vitamin D deficiency, unspecified: Secondary | ICD-10-CM | POA: Diagnosis not present

## 2020-03-01 NOTE — Patient Instructions (Signed)
Continue lifestyle intervention healthy eating and exercise .  Avoid sugar drinks.  Will notify you  of labs when available.    Health Maintenance, Female Adopting a healthy lifestyle and getting preventive care are important in promoting health and wellness. Ask your health care provider about:  The right schedule for you to have regular tests and exams.  Things you can do on your own to prevent diseases and keep yourself healthy. What should I know about diet, weight, and exercise? Eat a healthy diet   Eat a diet that includes plenty of vegetables, fruits, low-fat dairy products, and lean protein.  Do not eat a lot of foods that are high in solid fats, added sugars, or sodium. Maintain a healthy weight Body mass index (BMI) is used to identify weight problems. It estimates body fat based on height and weight. Your health care provider can help determine your BMI and help you achieve or maintain a healthy weight. Get regular exercise Get regular exercise. This is one of the most important things you can do for your health. Most adults should:  Exercise for at least 150 minutes each week. The exercise should increase your heart rate and make you sweat (moderate-intensity exercise).  Do strengthening exercises at least twice a week. This is in addition to the moderate-intensity exercise.  Spend less time sitting. Even light physical activity can be beneficial. Watch cholesterol and blood lipids Have your blood tested for lipids and cholesterol at 68 years of age, then have this test every 5 years. Have your cholesterol levels checked more often if:  Your lipid or cholesterol levels are high.  You are older than 68 years of age.  You are at high risk for heart disease. What should I know about cancer screening? Depending on your health history and family history, you may need to have cancer screening at various ages. This may include screening for:  Breast cancer.  Cervical  cancer.  Colorectal cancer.  Skin cancer.  Lung cancer. What should I know about heart disease, diabetes, and high blood pressure? Blood pressure and heart disease  High blood pressure causes heart disease and increases the risk of stroke. This is more likely to develop in people who have high blood pressure readings, are of African descent, or are overweight.  Have your blood pressure checked: ? Every 3-5 years if you are 70-79 years of age. ? Every year if you are 58 years old or older. Diabetes Have regular diabetes screenings. This checks your fasting blood sugar level. Have the screening done:  Once every three years after age 76 if you are at a normal weight and have a low risk for diabetes.  More often and at a younger age if you are overweight or have a high risk for diabetes. What should I know about preventing infection? Hepatitis B If you have a higher risk for hepatitis B, you should be screened for this virus. Talk with your health care provider to find out if you are at risk for hepatitis B infection. Hepatitis C Testing is recommended for:  Everyone born from [birth year] through 1964.  Anyone with known risk factors for hepatitis C. Sexually transmitted infections (STIs)  Get screened for STIs, including gonorrhea and chlamydia, if: ? You are sexually active and are younger than 68 years of age. ? You are older than 68 years of age and your health care provider tells you that you are at risk for this type of infection. ? Your sexual  activity has changed since you were last screened, and you are at increased risk for chlamydia or gonorrhea. Ask your health care provider if you are at risk.  Ask your health care provider about whether you are at high risk for HIV. Your health care provider may recommend a prescription medicine to help prevent HIV infection. If you choose to take medicine to prevent HIV, you should first get tested for HIV. You should then be tested every 3  months for as long as you are taking the medicine. Pregnancy  If you are about to stop having your period (premenopausal) and you may become pregnant, seek counseling before you get pregnant.  Take 400 to 800 micrograms (mcg) of folic acid every day if you become pregnant.  Ask for birth control (contraception) if you want to prevent pregnancy. Osteoporosis and menopause Osteoporosis is a disease in which the bones lose minerals and strength with aging. This can result in bone fractures. If you are 30 years old or older, or if you are at risk for osteoporosis and fractures, ask your health care provider if you should:  Be screened for bone loss.  Take a calcium or vitamin D supplement to lower your risk of fractures.  Be given hormone replacement therapy (HRT) to treat symptoms of menopause. Follow these instructions at home: Lifestyle  Do not use any products that contain nicotine or tobacco, such as cigarettes, e-cigarettes, and chewing tobacco. If you need help quitting, ask your health care provider.  Do not use street drugs.  Do not share needles.  Ask your health care provider for help if you need support or information about quitting drugs. Alcohol use  Do not drink alcohol if: ? Your health care provider tells you not to drink. ? You are pregnant, may be pregnant, or are planning to become pregnant.  If you drink alcohol: ? Limit how much you use to 0-1 drink a day. ? Limit intake if you are breastfeeding.  Be aware of how much alcohol is in your drink. In the [place] one drink equals one 12 oz bottle of beer (355 mL), one 5 oz glass of wine (148 mL), or one 1 oz glass of hard liquor (44 mL). General instructions  Schedule regular health, dental, and eye exams.  Stay current with your vaccines.  Tell your health care provider if: ? You often feel depressed. ? You have ever been abused or do not feel safe at home. Summary  Adopting a healthy lifestyle and getting  preventive care are important in promoting health and wellness.  Follow your health care provider's instructions about healthy diet, exercising, and getting tested or screened for diseases.  Follow your health care provider's instructions on monitoring your cholesterol and blood pressure. This information is not intended to replace advice given to you by your health care provider. Make sure you discuss any questions you have with your health care provider. Document Revised: 05/18/2018 Document Reviewed: 05/18/2018 Elsevier Patient Education  2020 [employer].

## 2020-03-01 NOTE — Progress Notes (Signed)
Chief Complaint  Patient presents with  . Annual Exam    Doing well  . Medication Management    HPI: [name] 69 y.o. comes in today for Preventive Medicare exam/ wellness visit .Since last visit. Doing ok  HT ocass reading  No concerns  In range  VIT d taking 2000 IU per day   Health Maintenance  Topic Date Due  . TETANUS/TDAP  06/08/2017  . HEMOGLOBIN A1C  08/28/2019  . MAMMOGRAM  06/11/2021  . COLONOSCOPY  11/17/2026  . INFLUENZA VACCINE  Completed  . DEXA SCAN  Completed  . COVID-19 Vaccine  Completed  . Hepatitis C Screening  Completed  . PNA vac Low Risk Adult  Completed   Health Maintenance Review LIFESTYLE:  Exercise:    Good  Walk 3 x per week  3+  Tobacco/ETS:  no Alcohol:   ocass Sugar beverages:  Daily one a day. Sleep:  Not  as good 6 hours    Drug use: no HH:  2  1 pet      Hearing:  Ok   Vision:  No limitations at present . Last eye check UTD  Safety:  Has smoke detector and wears seat belts.   No excess sun exposure. Sees dentist regularly.  Falls: n  Memory: Felt to be good  , no concern from her or her family.  Depression: No anhedonia unusual crying or depressive symptoms  Nutrition: Eats well balanced diet; adequate calcium and vitamin D. No swallowing chewing problems.  Injury: no major injuries in the last six months.  Other healthcare providers:  Reviewed today .   Preventive parameters: up-to-date  Reviewed   ADLS:   There are no problems or need for assistance  driving, feeding, obtaining food, dressing, toileting and bathing, managing money using phone. She is independent.   ROS:  GEN/ HEENT: No fever, significant weight changes sweats headaches vision problems hearing changes, CV/ PULM; No chest pain shortness of breath cough, syncope,edema  change in exercise tolerance. GI /GU: No adominal pain, vomiting, change in bowel habits. No blood in the stool. No significant GU symptoms. SKIN/HEME: ,no acute skin rashes  suspicious lesions or bleeding. No lymphadenopathy, nodules, masses.  NEURO/ PSYCH:  No neurologic signs such as weakness numbness. No depression anxiety. IMM/ Allergy: No unusual infections.  Allergy .   REST of 12 system review negative except as per HPI   Past Medical History:  Diagnosis Date  . Diverticulitis   . GERD (gastroesophageal reflux disease)   . 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 Hx of varicella   . Hypertension     Family History  Problem Relation Age of Onset  . Hyperlipidemia Mother   . Hypertension Mother   . COPD Mother        Deceased  . Hypertension Father   . Cancer - Other Father        Liver deceased  . Hypertension Sister   . Stroke Sister   . Arthritis Sister   . Obstructive Sleep Apnea Sister        And secondary atrial fibrillation.    Social History   Socioeconomic History  . Marital status: Married    Spouse name: Not on file  . Number of children: Not on file  . Years of education: Not on file  . Highest education level: Not on file  Occupational History  . Occupation: Retired  Tobacco Use  . Smoking status: Never Smoker  . Smokeless tobacco: Never Used  Vaping Use  . Vaping Use: Never used  Substance and Sexual Activity  . Alcohol use: Yes    Alcohol/week: 0.0 standard drinks    Comment: seldom  . Drug use: No  . Sexual activity: Not Currently    Partners: Male    Birth control/protection: Other-see comments    Comment: husband had prostate cancer and BTL  Other Topics Concern  . 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 Difficulty of Paying Living Expenses: Not hard at all  Food  Insecurity: No Food Insecurity  . Worried About Charity fundraiser in the Last Year: Never true  . Ran Out of Food in the Last Year: Never true  Transportation Needs: No Transportation Needs  . Lack of Transportation (Medical): No  . Lack of Transportation (Non-Medical): No  Physical Activity: Sufficiently Active  . Days of Exercise per Week: 4 days  . Minutes of Exercise per Session: 120 min  Stress: No Stress Concern Present  . Feeling of Stress : Not at all  Social Connections: Moderately Integrated  . Frequency of Communication with Friends and Family: More than three times a week  . Frequency of Social Gatherings with Friends and Family: More than three times a week  . Attends Religious Services: 1 to 4 times per year  . Active Member of Clubs or Organizations: No  . Attends Archivist Meetings: Never  . Marital Status: Married    Outpatient Encounter Medications as of 03/01/2020  Medication Sig  . benazepril-hydrochlorthiazide (LOTENSIN HCT) 20-12.5 MG tablet TAKE 1 TABLET BY MOUTH DAILY.  . cholecalciferol (VITAMIN D3) 25 MCG (1000 UNIT) tablet Take 2,000 Units by mouth daily.  . famotidine (PEPCID) 40 MG tablet Take 40 mg by mouth 2 (two) times daily.  Marland Kitchen ibuprofen (ADVIL) 200 MG tablet Take 200 mg by mouth as needed.  Marland Kitchen omeprazole (PRILOSEC) 40 MG capsule TAKE 1 CAPSULE 20 MINUTES BEFORE BREAKFAST DAILY   No facility-administered encounter medications on file as of 03/01/2020.    EXAM:  BP 124/72   Pulse 67   Temp 98 F (36.7 C) (Oral)   Ht 5' 2.75" (1.594 m)   Wt 216 lb 12.8 oz (98.3 kg)   LMP 06/08/2004   SpO2 98%   BMI 38.71 kg/m   Body mass index is 38.71 kg/m.  Physical Exam: Vital signs reviewed MBW:GYKZ is a well-developed well-nourished alert cooperative   who appears stated age in no acute distress.  HEENT: normocephalic atraumatic , Eyes: PERRL EOM's full, conjunctiva clear, Nares: paten,t no deformity discharge or tenderness., Ears: no  deformity EAC's clear TMs with normal landmarks. Mouthmasked NECK: supple without masses, thyromegaly or bruits. CHEST/PULM:  Clear to auscultation and percussion breath sounds equal no wheeze , rales or rhonchi. No chest wall deformities or tenderness. CV: PMI is nondisplaced, S1 S2 no gallops, murmurs, rubs. Peripheral pulses are full without delay.No JVD .  ABDOMEN: Bowel sounds normal nontender  No guard or rebound, no hepato splenomegal no CVA tenderness.   Breast:  normal by inspection . No dimpling, discharge, masses, tenderness or discharge . Extremtities:  No clubbing cyanosis or edema, no acute joint swelling or redness no focal atrophy NEURO:  Oriented x3, cranial nerves 3-12 appear to be intact, no obvious focal weakness,gait within normal limits no abnormal reflexes or asymmetrical SKIN: No acute rashes normal turgor, color, no bruising or petechiae. PSYCH: Oriented, good eye contact, no obvious depression anxiety, cognition and judgment appear normal. LN: no cervical axillary inguinal adenopathy No noted deficits in memory, attention, and speech.   Lab Results  Component Value Date   WBC 8.0 02/28/2019   HGB 12.3 02/28/2019   HCT 36.3 02/28/2019   PLT 298.0 02/28/2019   GLUCOSE 100 (H) 02/28/2019   CHOL 198 02/28/2019   TRIG 68.0 02/28/2019   HDL 69.20 02/28/2019   LDLCALC 115 (H) 02/28/2019   ALT 19 02/28/2019   AST 16 02/28/2019   NA 140 02/28/2019   K 4.3 02/28/2019   CL 101 02/28/2019   CREATININE 0.93 02/28/2019   BUN 23 02/28/2019   CO2 33 (H) 02/28/2019   TSH 1.49 02/28/2019   HGBA1C 6.3 02/28/2019   MICROALBUR 0.2 03/23/2014    ASSESSMENT AND PLAN:  Discussed the following assessment and plan:  Visit for preventive health examination  Essential hypertension - Plan: TSH, Hepatic function panel, Lipid panel, BASIC METABOLIC PANEL WITH GFR, CBC with Differential/Platelet, Hemoglobin A1c, VITAMIN D 25 Hydroxy (Vit-D Deficiency, Fractures), VITAMIN D 25  Hydroxy (Vit-D Deficiency, Fractures), Hemoglobin A1c, CBC with Differential/Platelet, BASIC METABOLIC PANEL WITH GFR, Lipid panel, Hepatic function panel, TSH  Medication management - Plan: TSH, Hepatic function panel, Lipid panel, BASIC METABOLIC PANEL WITH GFR, CBC with Differential/Platelet, Hemoglobin A1c, VITAMIN D 25 Hydroxy (Vit-D Deficiency, Fractures), VITAMIN D 25 Hydroxy (Vit-D Deficiency, Fractures), Hemoglobin A1c, CBC with Differential/Platelet, BASIC METABOLIC PANEL WITH GFR, Lipid panel, Hepatic function panel, TSH  Hyperglycemia - Plan: TSH, Hepatic function panel, Lipid panel, BASIC METABOLIC PANEL WITH GFR, CBC with Differential/Platelet, Hemoglobin A1c, VITAMIN D 25 Hydroxy (Vit-D Deficiency, Fractures), VITAMIN D 25 Hydroxy (Vit-D Deficiency, Fractures), Hemoglobin A1c, CBC with Differential/Platelet, BASIC METABOLIC PANEL WITH GFR, Lipid panel, Hepatic function panel, TSH  Hyperlipidemia, unspecified hyperlipidemia type - Plan: TSH, Hepatic function panel, Lipid panel, BASIC METABOLIC PANEL WITH GFR, CBC with Differential/Platelet, Hemoglobin A1c, VITAMIN D 25 Hydroxy (Vit-D Deficiency, Fractures), VITAMIN D 25 Hydroxy (Vit-D Deficiency, Fractures), Hemoglobin A1c, CBC with Differential/Platelet, BASIC METABOLIC PANEL WITH GFR, Lipid panel, Hepatic function panel, TSH  Vitamin D deficiency - check level today  - Plan: TSH, Hepatic function panel, Lipid panel, BASIC METABOLIC PANEL WITH GFR, CBC with Differential/Platelet, Hemoglobin A1c, VITAMIN D 25 Hydroxy (Vit-D Deficiency, Fractures), VITAMIN D 25 Hydroxy (Vit-D Deficiency, Fractures), Hemoglobin A1c, CBC with Differential/Platelet, BASIC METABOLIC PANEL WITH GFR, Lipid panel, Hepatic function panel, TSH  Need for influenza vaccination - Plan: Flu Vaccine QUAD High Dose(Fluad) lsi  Monitor labs and fu as indicated  Patient Care Team: Aymee Fomby, Standley Brooking, MD as PCP - General (Internal Medicine) Juanita Craver, MD as Attending  Physician (Gastroenterology) Jola Schmidt, MD as Consulting Physician (Ophthalmology) Megan Salon, MD as Consulting Physician (Gynecology)  Patient Instructions  Continue lifestyle intervention healthy eating and exercise .  Avoid sugar drinks.  Will notify you  of labs when available.    Health Maintenance, Female Adopting a healthy lifestyle and getting preventive care are important in promoting health and wellness. Ask your health care provider about:  The right schedule for you to have  regular tests and exams.  Things you can do on your own to prevent diseases and keep yourself healthy. What should I know about diet, weight, and exercise? Eat a healthy diet   Eat a diet that includes plenty of vegetables, fruits, low-fat dairy products, and lean protein.  Do not eat a lot of foods that are high in solid fats, added sugars, or sodium. Maintain a healthy weight Body mass index (BMI) is used to identify weight problems. It estimates body fat based on height and weight. Your health care provider can help determine your BMI and help you achieve or maintain a healthy weight. Get regular exercise Get regular exercise. This is one of the most important things you can do for your health. Most adults should:  Exercise for at least 150 minutes each week. The exercise should increase your heart rate and make you sweat (moderate-intensity exercise).  Do strengthening exercises at least twice a week. This is in addition to the moderate-intensity exercise.  Spend less time sitting. Even light physical activity can be beneficial. Watch cholesterol and blood lipids Have your blood tested for lipids and cholesterol at 68 years of age, then have this test every 5 years. Have your cholesterol levels checked more often if:  Your lipid or cholesterol levels are high.  You are older than 68 years of age.  You are at high risk for heart disease. What should I know about cancer  screening? Depending on your health history and family history, you may need to have cancer screening at various ages. This may include screening for:  Breast cancer.  Cervical cancer.  Colorectal cancer.  Skin cancer.  Lung cancer. What should I know about heart disease, diabetes, and high blood pressure? Blood pressure and heart disease  High blood pressure causes heart disease and increases the risk of stroke. This is more likely to develop in people who have high blood pressure readings, are of African descent, or are overweight.  Have your blood pressure checked: ? Every 3-5 years if you are 72-4 years of age. ? Every year if you are 62 years old or older. Diabetes Have regular diabetes screenings. This checks your fasting blood sugar level. Have the screening done:  Once every three years after age 33 if you are at a normal weight and have a low risk for diabetes.  More often and at a younger age if you are overweight or have a high risk for diabetes. What should I know about preventing infection? Hepatitis B If you have a higher risk for hepatitis B, you should be screened for this virus. Talk with your health care provider to find out if you are at risk for hepatitis B infection. Hepatitis C Testing is recommended for:  Everyone born from [birth year] through 1964.  Anyone with known risk factors for hepatitis C. Sexually transmitted infections (STIs)  Get screened for STIs, including gonorrhea and chlamydia, if: ? You are sexually active and are younger than 68 years of age. ? You are older than 68 years of age and your health care provider tells you that you are at risk for this type of infection. ? Your sexual activity has changed since you were last screened, and you are at increased risk for chlamydia or gonorrhea. Ask your health care provider if you are at risk.  Ask your health care provider about whether you are at high risk for HIV. Your health care provider may  recommend a prescription medicine to help prevent  HIV infection. If you choose to take medicine to prevent HIV, you should first get tested for HIV. You should then be tested every 3 months for as long as you are taking the medicine. Pregnancy  If you are about to stop having your period (premenopausal) and you may become pregnant, seek counseling before you get pregnant.  Take 400 to 800 micrograms (mcg) of folic acid every day if you become pregnant.  Ask for birth control (contraception) if you want to prevent pregnancy. Osteoporosis and menopause Osteoporosis is a disease in which the bones lose minerals and strength with aging. This can result in bone fractures. If you are 27 years old or older, or if you are at risk for osteoporosis and fractures, ask your health care provider if you should:  Be screened for bone loss.  Take a calcium or vitamin D supplement to lower your risk of fractures.  Be given hormone replacement therapy (HRT) to treat symptoms of menopause. Follow these instructions at home: Lifestyle  Do not use any products that contain nicotine or tobacco, such as cigarettes, e-cigarettes, and chewing tobacco. If you need help quitting, ask your health care provider.  Do not use street drugs.  Do not share needles.  Ask your health care provider for help if you need support or information about quitting drugs. Alcohol use  Do not drink alcohol if: ? Your health care provider tells you not to drink. ? You are pregnant, may be pregnant, or are planning to become pregnant.  If you drink alcohol: ? Limit how much you use to 0-1 drink a day. ? Limit intake if you are breastfeeding.  Be aware of how much alcohol is in your drink. In the [place] one drink equals one 12 oz bottle of beer (355 mL), one 5 oz glass of wine (148 mL), or one 1 oz glass of hard liquor (44 mL). General instructions  Schedule regular health, dental, and eye exams.  Stay current with your  vaccines.  Tell your health care provider if: ? You often feel depressed. ? You have ever been abused or do not feel safe at home. Summary  Adopting a healthy lifestyle and getting preventive care are important in promoting health and wellness.  Follow your health care provider's instructions about healthy diet, exercising, and getting tested or screened for diseases.  Follow your health care provider's instructions on monitoring your cholesterol and blood pressure. This information is not intended to replace advice given to you by your health care provider. Make sure you discuss any questions you have with your health care provider. Document Revised: 05/18/2018 Document Reviewed: 05/18/2018 Elsevier Patient Education  2020 Aldora Raechelle Sarti M.D.

## 2020-03-02 LAB — LIPID PANEL
Cholesterol: 200 mg/dL — ABNORMAL HIGH (ref <200)
HDL: 65 mg/dL (ref >=50)
LDL Cholesterol (Calc): 116 mg/dL (calc) — ABNORMAL HIGH
Non-HDL Cholesterol (Calc): 135 mg/dL (calc) — ABNORMAL HIGH (ref <130)
Total CHOL/HDL Ratio: 3.1 (calc) (ref <5.0)
Triglycerides: 86 mg/dL (ref <150)

## 2020-03-02 LAB — BASIC METABOLIC PANEL WITH GFR
BUN: 21 mg/dL (ref 7–25)
CO2: 31 mmol/L (ref 20–32)
Calcium: 9.1 mg/dL (ref 8.6–10.4)
Chloride: 102 mmol/L (ref 98–110)
Creat: 0.79 mg/dL (ref 0.50–0.99)
GFR, Est African American: 89 mL/min/{1.73_m2} (ref >=60)
GFR, Est Non African American: 77 mL/min/{1.73_m2} (ref >=60)
Glucose, Bld: 116 mg/dL — ABNORMAL HIGH (ref 65–99)
Potassium: 3.9 mmol/L (ref 3.5–5.3)
Sodium: 140 mmol/L (ref 135–146)

## 2020-03-02 LAB — CBC WITH DIFFERENTIAL/PLATELET
Absolute Monocytes: 512 cells/uL (ref 200–950)
Basophils Absolute: 50 cells/uL (ref 0–200)
Basophils Relative: 0.6 %
Eosinophils Absolute: 319 cells/uL (ref 15–500)
Eosinophils Relative: 3.8 %
HCT: 37 % (ref 35.0–45.0)
Hemoglobin: 12.2 g/dL (ref 11.7–15.5)
Lymphs Abs: 1722 cells/uL (ref 850–3900)
MCH: 29.5 pg (ref 27.0–33.0)
MCHC: 33 g/dL (ref 32.0–36.0)
MCV: 89.6 fL (ref 80.0–100.0)
MPV: 10 fL (ref 7.5–12.5)
Monocytes Relative: 6.1 %
Neutro Abs: 5796 cells/uL (ref 1500–7800)
Neutrophils Relative %: 69 %
Platelets: 313 10*3/uL (ref 140–400)
RBC: 4.13 10*6/uL (ref 3.80–5.10)
RDW: 12.5 % (ref 11.0–15.0)
Total Lymphocyte: 20.5 %
WBC: 8.4 10*3/uL (ref 3.8–10.8)

## 2020-03-02 LAB — HEMOGLOBIN A1C
Hgb A1c MFr Bld: 6 % of total Hgb — ABNORMAL HIGH (ref <5.7)
Mean Plasma Glucose: 126 (calc)
eAG (mmol/L): 7 (calc)

## 2020-03-02 LAB — HEPATIC FUNCTION PANEL
AG Ratio: 1.4 (calc) (ref 1.0–2.5)
ALT: 19 U/L (ref 6–29)
AST: 17 U/L (ref 10–35)
Albumin: 4 g/dL (ref 3.6–5.1)
Alkaline phosphatase (APISO): 77 U/L (ref 37–153)
Bilirubin, Direct: 0.1 mg/dL (ref 0.0–0.2)
Globulin: 2.8 g/dL (calc) (ref 1.9–3.7)
Indirect Bilirubin: 0.5 mg/dL (calc) (ref 0.2–1.2)
Total Bilirubin: 0.6 mg/dL (ref 0.2–1.2)
Total Protein: 6.8 g/dL (ref 6.1–8.1)

## 2020-03-02 LAB — TSH: TSH: 2.12 mIU/L (ref 0.40–4.50)

## 2020-03-02 LAB — VITAMIN D 25 HYDROXY (VIT D DEFICIENCY, FRACTURES): Vit D, 25-Hydroxy: 34 ng/mL (ref 30–100)

## 2020-03-12 NOTE — Progress Notes (Signed)
Results show hemoglobin A1c in the prediabetic range but stable and slightly better than last year. Cholesterol could be better up slightly from last year continue healthy lifestyle intervention Could consider adding a cholesterol medicine such as a statin as an option.  To lower cholesterol.  Let us know if you wish to consider this.

## 2020-03-31 ENCOUNTER — Other Ambulatory Visit: Payer: Self-pay | Admitting: Internal Medicine

## 2020-06-13 ENCOUNTER — Other Ambulatory Visit: Payer: Self-pay | Admitting: Internal Medicine

## 2020-06-13 DIAGNOSIS — Z1231 Encounter for screening mammogram for malignant neoplasm of breast: Secondary | ICD-10-CM

## 2020-06-29 ENCOUNTER — Other Ambulatory Visit: Payer: Self-pay | Admitting: Internal Medicine

## 2020-07-23 ENCOUNTER — Other Ambulatory Visit: Payer: Self-pay

## 2020-07-23 ENCOUNTER — Ambulatory Visit
Admission: RE | Admit: 2020-07-23 | Discharge: 2020-07-23 | Disposition: A | Discharge status: Home / Self Care | Payer: Medicare HMO | Source: Ambulatory Visit | Attending: Internal Medicine | Admitting: Internal Medicine

## 2020-07-23 DIAGNOSIS — Z1231 Encounter for screening mammogram for malignant neoplasm of breast: Secondary | ICD-10-CM

## 2020-10-02 ENCOUNTER — Other Ambulatory Visit: Payer: Self-pay | Admitting: Internal Medicine

## 2020-10-10 DIAGNOSIS — M7062 Trochanteric bursitis, left hip: Secondary | ICD-10-CM | POA: Diagnosis not present

## 2020-10-10 DIAGNOSIS — M545 Low back pain, unspecified: Secondary | ICD-10-CM | POA: Diagnosis not present

## 2020-12-20 ENCOUNTER — Other Ambulatory Visit: Payer: Self-pay | Admitting: Internal Medicine

## 2021-01-27 DIAGNOSIS — H2513 Age-related nuclear cataract, bilateral: Secondary | ICD-10-CM | POA: Diagnosis not present

## 2021-01-27 DIAGNOSIS — D3131 Benign neoplasm of right choroid: Secondary | ICD-10-CM | POA: Diagnosis not present

## 2021-01-27 DIAGNOSIS — H524 Presbyopia: Secondary | ICD-10-CM | POA: Diagnosis not present

## 2021-01-29 ENCOUNTER — Other Ambulatory Visit: Payer: Self-pay

## 2021-01-29 ENCOUNTER — Ambulatory Visit (INDEPENDENT_AMBULATORY_CARE_PROVIDER_SITE_OTHER): Payer: Medicare HMO

## 2021-01-29 DIAGNOSIS — Z0000 Encounter for general adult medical examination without abnormal findings: Secondary | ICD-10-CM | POA: Diagnosis not present

## 2021-01-29 NOTE — Patient Instructions (Addendum)
[name] , Thank you for taking time to come for your Medicare Wellness Visit. I appreciate your ongoing commitment to your health goals. Please review the following plan we discussed and let me know if I can assist you in the future.   Screening recommendations/referrals: Colonoscopy: Done 11/16/16 repeat every 10 years due 11/17/26 Mammogram: Done 07/23/20 repeat every year Bone Density: Done 06/12/19 repeat every 2 years  Recommended yearly ophthalmology/optometry visit for glaucoma screening and checkup Recommended yearly dental visit for hygiene and checkup  Vaccinations: Influenza vaccine: Due Pneumococcal vaccine: Completed  Tdap vaccine: Due  Shingles vaccine: Completed 11/25/17 & 02/07/20   Covid-19:Completed 1/21, 2/8, 02/07/20 & 10/07/20  Advanced directives: Advance directive discussed with you today. I have provided a copy for you to complete at home and have notarized. Once this is complete please bring a copy in to our office so we can scan it into your chart.  Conditions/risks identified: Lose weight   Next appointment: Follow up in one year for your annual wellness visit    Preventive Care 65 Years and Older, Female Preventive care refers to lifestyle choices and visits with your health care provider that can promote health and wellness. What does preventive care include? A yearly physical exam. This is also called an annual well check. Dental exams once or twice a year. Routine eye exams. Ask your health care provider how often you should have your eyes checked. Personal lifestyle choices, including: Daily care of your teeth and gums. Regular physical activity. Eating a healthy diet. Avoiding tobacco and drug use. Limiting alcohol use. Practicing safe sex. Taking low-dose aspirin every day. Taking vitamin and mineral supplements as recommended by your health care provider. What happens during an annual well check? The services and screenings done by your health care  provider during your annual well check will depend on your age, overall health, lifestyle risk factors, and family history of disease. Counseling  Your health care provider may ask you questions about your: Alcohol use. Tobacco use. Drug use. Emotional well-being. Home and relationship well-being. Sexual activity. Eating habits. History of falls. Memory and ability to understand (cognition). Work and work Statistician. Reproductive health. Screening  You may have the following tests or measurements: Height, weight, and BMI. Blood pressure. Lipid and cholesterol levels. These may be checked every 5 years, or more frequently if you are over 35 years old. Skin check. Lung cancer screening. You may have this screening every year starting at age 58 if you have a 30-pack-year history of smoking and currently smoke or have quit within the past 15 years. Fecal occult blood test (FOBT) of the stool. You may have this test every year starting at age 40. Flexible sigmoidoscopy or colonoscopy. You may have a sigmoidoscopy every 5 years or a colonoscopy every 10 years starting at age 23. Hepatitis C blood test. Hepatitis B blood test. Sexually transmitted disease (STD) testing. Diabetes screening. This is done by checking your blood sugar (glucose) after you have not eaten for a while (fasting). You may have this done every 1-3 years. Bone density scan. This is done to screen for osteoporosis. You may have this done starting at age 81. Mammogram. This may be done every 1-2 years. Talk to your health care provider about how often you should have regular mammograms. Talk with your health care provider about your test results, treatment options, and if necessary, the need for more tests. Vaccines  Your health care provider may recommend certain vaccines, such as:  Influenza vaccine. This is recommended every year. Tetanus, diphtheria, and acellular pertussis (Tdap, Td) vaccine. You may need a Td  booster every 10 years. Zoster vaccine. You may need this after age 25. Pneumococcal 13-valent conjugate (PCV13) vaccine. One dose is recommended after age 81. Pneumococcal polysaccharide (PPSV23) vaccine. One dose is recommended after age 56. Talk to your health care provider about which screenings and vaccines you need and how often you need them. This information is not intended to replace advice given to you by your health care provider. Make sure you discuss any questions you have with your health care provider. Document Released: 06/21/2015 Document Revised: 02/12/2016 Document Reviewed: 03/26/2015 Elsevier Interactive Patient Education  2017 [place] Prevention in the Home Falls can cause injuries. They can happen to people of all ages. There are many things you can do to make your home safe and to help prevent falls. What can I do on the outside of my home? Regularly fix the edges of walkways and driveways and fix any cracks. Remove anything that might make you trip as you walk through a door, such as a raised step or threshold. Trim any bushes or trees on the path to your home. Use bright outdoor lighting. Clear any walking paths of anything that might make someone trip, such as rocks or tools. Regularly check to see if handrails are loose or broken. Make sure that both sides of any steps have handrails. Any raised decks and porches should have guardrails on the edges. Have any leaves, snow, or ice cleared regularly. Use sand or salt on walking paths during winter. Clean up any spills in your garage right away. This includes oil or grease spills. What can I do in the bathroom? Use night lights. Install grab bars by the toilet and in the tub and shower. Do not use towel bars as grab bars. Use non-skid mats or decals in the tub or shower. If you need to sit down in the shower, use a plastic, non-slip stool. Keep the floor dry. Clean up any water that spills on the floor  as soon as it happens. Remove soap buildup in the tub or shower regularly. Attach bath mats securely with double-sided non-slip rug tape. Do not have throw rugs and other things on the floor that can make you trip. What can I do in the bedroom? Use night lights. Make sure that you have a light by your bed that is easy to reach. Do not use any sheets or blankets that are too big for your bed. They should not hang down onto the floor. Have a firm chair that has side arms. You can use this for support while you get dressed. Do not have throw rugs and other things on the floor that can make you trip. What can I do in the kitchen? Clean up any spills right away. Avoid walking on wet floors. Keep items that you use a lot in easy-to-reach places. If you need to reach something above you, use a strong step stool that has a grab bar. Keep electrical cords out of the way. Do not use floor polish or wax that makes floors slippery. If you must use wax, use non-skid floor wax. Do not have throw rugs and other things on the floor that can make you trip. What can I do with my stairs? Do not leave any items on the stairs. Make sure that there are handrails on both sides of the stairs and use them.  Fix handrails that are broken or loose. Make sure that handrails are as long as the stairways. Check any carpeting to make sure that it is firmly attached to the stairs. Fix any carpet that is loose or worn. Avoid having throw rugs at the top or bottom of the stairs. If you do have throw rugs, attach them to the floor with carpet tape. Make sure that you have a light switch at the top of the stairs and the bottom of the stairs. If you do not have them, ask someone to add them for you. What else can I do to help prevent falls? Wear shoes that: Do not have high heels. Have rubber bottoms. Are comfortable and fit you well. Are closed at the toe. Do not wear sandals. If you use a stepladder: Make sure that it is  fully opened. Do not climb a closed stepladder. Make sure that both sides of the stepladder are locked into place. Ask someone to hold it for you, if possible. Clearly mark and make sure that you can see: Any grab bars or handrails. First and last steps. Where the edge of each step is. Use tools that help you move around (mobility aids) if they are needed. These include: Canes. Walkers. Scooters. Crutches. Turn on the lights when you go into a dark area. Replace any light bulbs as soon as they burn out. Set up your furniture so you have a clear path. Avoid moving your furniture around. If any of your floors are uneven, fix them. If there are any pets around you, be aware of where they are. Review your medicines with your doctor. Some medicines can make you feel dizzy. This can increase your chance of falling. Ask your doctor what other things that you can do to help prevent falls. This information is not intended to replace advice given to you by your health care provider. Make sure you discuss any questions you have with your health care provider. Document Released: 03/21/2009 Document Revised: 10/31/2015 Document Reviewed: 06/29/2014 Elsevier Interactive Patient Education  2017 [employer].

## 2021-01-29 NOTE — Progress Notes (Signed)
Virtual Visit via Telephone Note  I connected with  [name] on 01/29/21 at  9:30 AM EDT by telephone and verified that I am speaking with the correct person using two identifiers.  Medicare Annual Wellness visit completed telephonically due to Covid-19 pandemic.   Persons participating in this call: This Health Coach and this patient.   Location: Patient: Home Provider: Office   I discussed the limitations, risks, security and privacy concerns of performing an evaluation and management service by telephone and the availability of in person appointments. The patient expressed understanding and agreed to proceed.  Unable to perform video visit due to video visit attempted and failed and/or patient does not have video capability.   Some vital signs may be absent or patient reported.   Willette Brace, LPN   Subjective:   [name] is a 70 y.o. female who presents for Medicare Annual (Subsequent) preventive examination.  Review of Systems     Cardiac Risk Factors include: advanced age (>24mn, >>17women);diabetes mellitus;hypertension;obesity (BMI >30kg/m2)     Objective:    There were no vitals filed for this visit. There is no height or weight on file to calculate BMI.  Advanced Directives 01/29/2021 01/24/2020 10/23/2013  Does Patient Have a Medical Advance Directive? Yes No Patient does not have advance directive  Does patient want to make changes to medical advance directive? Yes (MAU/Ambulatory/Procedural Areas - Information given) - -  Would patient like information on creating a medical advance directive? - Yes (MAU/Ambulatory/Procedural Areas - Information given) -    Current Medications (verified) Outpatient Encounter Medications as of 01/29/2021  Medication Sig   benazepril-hydrochlorthiazide (LOTENSIN HCT) 20-12.5 MG tablet TAKE 1 TABLET BY MOUTH EVERY DAY   ibuprofen (ADVIL) 200 MG tablet Take 200 mg by mouth as needed.   omeprazole (PRILOSEC) 40 MG  capsule TAKE 1 CAPSULE 20 MINUTES BEFORE BREAKFAST DAILY   polyethylene glycol (MIRALAX / GLYCOLAX) 17 g packet Take 17 g by mouth daily. Every other day   famotidine (PEPCID) 40 MG tablet Take 40 mg by mouth 2 (two) times daily. (Patient not taking: Reported on 01/29/2021)   PFIZER-BIONT COVID-19 VAC-TRIS SUSP injection    [DISCONTINUED] cholecalciferol (VITAMIN D3) 25 MCG (1000 UNIT) tablet Take 2,000 Units by mouth daily. (Patient not taking: Reported on 01/29/2021)   No facility-administered encounter medications on file as of 01/29/2021.    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   Social History   Socioeconomic History   Marital status: Married    Spouse  name: Not on file   Number of children: Not on file   Years of education: Not on file   Highest education level: Not on file  Occupational History   Occupation: Retired  Tobacco Use   Smoking status: Never   Smokeless tobacco: Never  Vaping Use   Vaping Use: Never used  Substance and Sexual Activity   Alcohol use: Yes    Alcohol/week: 0.0 standard drinks    Comment: seldom   Drug use: No   Sexual activity: Not Currently    Partners: Male    Birth control/protection: Other-see comments     Comment: husband had prostate cancer and BTL  Other Topics Concern   Not on file  Social History Narrative   X-ray tech    Married household with 2 lives with husband and the dog negative ETS   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   Social alcohol no tobacco some caffeine    fa   G2P2   Social Determinants of Health   Financial Resource Strain: Low Risk    Difficulty of Paying Living Expenses: Not hard at all  Food Insecurity: No Food Insecurity   Worried About Charity fundraiser in the Last Year: Never true   [place] in the Last Year: Never true  Transportation Needs: No Transportation Needs   Lack of Transportation (Medical): No   Lack of Transportation (Non-Medical): No  Physical Activity: Sufficiently Active   Days of Exercise per Week: 3 days   Minutes of Exercise per Session: 90 min  Stress: No Stress Concern Present   Feeling of Stress : Only a little  Social Connections: Moderately Integrated   Frequency of Communication with Friends and Family: More than three times a week   Frequency of Social Gatherings with Friends and Family: More than three times a week   Attends Religious Services: More than 4 times per year   Active Member of [employer] or Organizations: No   Attends Music therapist: Never   Marital Status: Married    Tobacco Counseling Counseling given: Not Answered   Clinical Intake:  Pre-visit preparation completed: Yes        BMI - recorded: 38.71 Nutritional Status: BMI > 30  Obese Nutritional Risks: None Diabetes: Yes CBG done?: No Did pt. bring in CBG monitor from home?: No  How often do you need to have someone help you when you read instructions, pamphlets, or other written materials from your doctor or pharmacy?: 1 - Never  Diabetic?Nutrition Risk Assessment:  Has the patient had any N/V/D within the last 2 months?  No  Does the patient have any non-healing wounds?  No  Has the  patient had any unintentional weight loss or weight gain?  No   Diabetes:  Is the patient diabetic?  Yes  pt denies If diabetic, was a CBG obtained today?  No  Did the patient bring in their glucometer from home?  No  How often do you monitor your CBG's? /A  Are you having any financial strains with the device, your supplies or your medication? No .  Does the patient want to be seen by Chronic Care Management for management of their diabetes?  No  Would the patient like to be referred to a Nutritionist or for Diabetic Management?  No   Diabetic Exams: Not a candidate   Interpreter Needed?: No  Information entered by :: Charlott Rakes,  LPN   Activities of Daily Living In your present state of health, do you have any difficulty performing the following activities: 01/29/2021  Hearing? N  Vision? N  Difficulty concentrating or making decisions? N  Walking or climbing stairs? N  Dressing or bathing? N  Doing errands, shopping? N  Preparing Food and eating ? N  Using the Toilet? N  In the past six months, have you accidently leaked urine? N  Do you have problems with loss of bowel control? N  Managing your Medications? N  Managing your Finances? N  Housekeeping or managing your Housekeeping? N  Some recent data might be hidden    Patient Care Team: Panosh, Standley Brooking, MD as PCP - General (Internal Medicine) Juanita Craver, MD as Attending Physician (Gastroenterology) Jola Schmidt, MD as Consulting Physician (Ophthalmology) Megan Salon, MD as Consulting Physician (Gynecology)  Indicate any recent Medical Services you may have received from other than [facility] providers in the past year (date may be approximate).     Assessment:   This is a routine wellness examination for [facility].  Hearing/Vision screen Hearing Screening - Comments:: Pt denies any hearing issues  Vision Screening - Comments:: Pt follow up  with Dr Valetta Close for annual eye exams  Dietary issues and exercise  activities discussed: Current Exercise Habits: Home exercise routine, Type of exercise: walking, Time (Minutes): > 60, Frequency (Times/Week): 3, Weekly Exercise (Minutes/Week): 0   Goals Addressed             This Visit's Progress    Patient Stated       Lose weight        Depression Screen PHQ 2/9 Scores 01/29/2021 01/24/2020 02/28/2019 02/28/2018 01/26/2017 08/29/2013  PHQ - 2 Score 0 0 0 0 0 0    Fall Risk Fall Risk  01/29/2021 01/24/2020 02/28/2018 01/26/2017 08/29/2013  Falls in the past year? 0 0 No No No  Number falls in past yr: 0 0 - - -  Injury with Fall? 0 0 - - -  Risk for fall due to : Impaired vision Impaired vision - - -  Follow up Falls prevention discussed - - - -    FALL RISK PREVENTION PERTAINING TO THE HOME:  Any stairs in or around the home? No  If so, are there any without handrails? No  Home free of loose throw rugs in walkways, pet beds, electrical cords, etc? Yes  Adequate lighting in your home to reduce risk of falls? Yes   ASSISTIVE DEVICES UTILIZED TO PREVENT FALLS:  Life alert? No  Use of a cane, walker or w/c? No  Grab bars in the bathroom? Yes  Shower chair or bench in shower? Yes  Elevated toilet seat or a handicapped toilet? No   TIMED UP AND GO:  Was the test performed? No .   Cognitive Function:     6CIT Screen 01/29/2021 01/24/2020  What Year? 0 points 0 points  What month? 0 points 0 points  What time? 0 points -  Count back from 20 0 points 0 points  Months in reverse 0 points 0 points  Repeat phrase 0 points 0 points  Total Score 0 -    Immunizations Immunization History  Administered Date(s) Administered   Fluad Quad(high Dose 65+) 03/01/2020   Influenza Split 03/08/2013   Influenza, High Dose Seasonal PF 02/02/2019   Influenza-Unspecified 03/30/2014   PFIZER Comirnaty(Gray Top)Covid-19 Tri-Sucrose Vaccine 10/07/2020   PFIZER(Purple Top)SARS-COV-2 Vaccination 06/29/2019, 07/17/2019, 02/07/2020   Pneumococcal  Conjugate-13 01/26/2017   Pneumococcal Polysaccharide-23 02/28/2018   Tdap 06/09/2007   Zoster Recombinat (Shingrix) 11/25/2017, 08/13/2018    TDAP status: Due, Education has been provided regarding the importance of this vaccine. Advised may receive this vaccine at local pharmacy or Health Dept. Aware to provide a copy of the vaccination record if obtained from local pharmacy or Health Dept. Verbalized acceptance and understanding.  Flu Vaccine status: Due, Education has been provided regarding the importance of this vaccine. Advised may receive this vaccine at local pharmacy or Health Dept. Aware to provide a copy of the vaccination record if obtained from local pharmacy or Health Dept. Verbalized acceptance and understanding.  Pneumococcal vaccine status: Up to date  Covid-19 vaccine status: Completed vaccines  Qualifies for Shingles Vaccine? Yes   Zostavax completed Yes   Shingrix Completed?: Yes  Screening Tests Health Maintenance  Topic Date Due   TETANUS/TDAP  06/08/2017   HEMOGLOBIN A1C  08/29/2020   INFLUENZA VACCINE  01/06/2021   COVID-19 Vaccine (5 - Booster for [place] series) 02/07/2021   MAMMOGRAM  07/23/2022   COLONOSCOPY (Pts 45-87yr Insurance coverage will need to be confirmed)  11/17/2026   DEXA SCAN  Completed   Hepatitis C Screening  Completed   PNA vac Low Risk Adult  Completed   Zoster Vaccines- Shingrix  Completed   HPV VACCINES  Aged Out    Health Maintenance  Health Maintenance Due  Topic Date Due   TETANUS/TDAP  06/08/2017   HEMOGLOBIN A1C  08/29/2020   INFLUENZA VACCINE  01/06/2021    Colorectal cancer screening: Type of screening: Colonoscopy. Completed 11/16/16. Repeat every 10 years  Mammogram status: Completed 07/23/20. Repeat every year  Bone Density status: Completed 06/12/19. Results reflect: Bone density results: NORMAL. Repeat every 2 years.    Additional Screening:  Hepatitis C Screening:  Completed 02/28/18  Vision Screening:  Recommended annual ophthalmology exams for early detection of glaucoma and other disorders of the eye. Is the patient up to date with their annual eye exam?  Yes  Who is the provider or what is the name of the office in which the patient attends annual eye exams? [facility] If pt is not established with a provider, would they like to be referred to a provider to establish care? No .   Dental Screening: Recommended annual dental exams for proper oral hygiene  Community Resource Referral / Chronic Care Management: CRR required this visit?  No   CCM required this visit?  No      Plan:     I have personally reviewed and noted the following in the patient's chart:   Medical and social history Use of alcohol, tobacco or illicit drugs  Current medications and supplements including opioid prescriptions.  Functional ability and status Nutritional status Physical activity Advanced directives List of other physicians Hospitalizations, surgeries, and ER visits in previous 12 months Vitals Screenings to include cognitive, depression, and falls Referrals and appointments  In addition, I have reviewed and discussed with patient certain preventive protocols, quality metrics, and best practice recommendations. A written personalized care plan for preventive services as well as general preventive health recommendations were provided to patient.     TWillette Brace LPN   [id number]  Nurse Notes: Pt stated that she is not a Diabetic and it shows on her Health maintenance as such, Please advise

## 2021-02-13 DIAGNOSIS — K219 Gastro-esophageal reflux disease without esophagitis: Secondary | ICD-10-CM | POA: Diagnosis not present

## 2021-02-13 DIAGNOSIS — K5730 Diverticulosis of large intestine without perforation or abscess without bleeding: Secondary | ICD-10-CM | POA: Diagnosis not present

## 2021-02-13 DIAGNOSIS — K5900 Constipation, unspecified: Secondary | ICD-10-CM | POA: Diagnosis not present

## 2021-02-13 LAB — BASIC METABOLIC PANEL
BUN: 19 (ref 4–21)
CO2: 27 — AB (ref 13–22)
Chloride: 96 — AB (ref 99–108)
Creatinine: 1 (ref 0.5–1.1)
Glucose: 157
Potassium: 3.6 (ref 3.4–5.3)
Sodium: 138 (ref 137–147)

## 2021-02-13 LAB — COMPREHENSIVE METABOLIC PANEL
Albumin: 4.3 (ref 3.5–5.0)
Calcium: 9.4 (ref 8.7–10.7)
GFR calc non Af Amer: 62

## 2021-02-13 LAB — CBC: RBC: 4.22 (ref 3.87–5.11)

## 2021-02-13 LAB — CBC AND DIFFERENTIAL
HCT: 37 (ref 36–46)
Hemoglobin: 12.8 (ref 12.0–16.0)
WBC: 9.6

## 2021-02-13 LAB — HEPATIC FUNCTION PANEL: Alkaline Phosphatase: 102 (ref 25–125)

## 2021-02-17 ENCOUNTER — Encounter: Payer: Self-pay | Admitting: Internal Medicine

## 2021-03-03 NOTE — Progress Notes (Signed)
Chief Complaint  Patient presents with   Annual Exam   Medication Management     HPI: Patient  [name]  70 y.o. comes in today for Preventive Health Care visit   GI  Now off omeprezole  now on pepcid  has seen dr Collene Mares who did some lab work  Bp has been good   no cv rep sx  Working on weight  feel well.  Taking vit d 2000 iu per day  Health Maintenance  Topic Date Due   TETANUS/TDAP  06/08/2017   COVID-19 Vaccine (5 - Booster for Pfizer series) 02/07/2021   HEMOGLOBIN A1C  09/01/2021   MAMMOGRAM  07/23/2022   COLONOSCOPY (Pts 45-48yr Insurance coverage will need to be confirmed)  11/17/2026   INFLUENZA VACCINE  Completed   DEXA SCAN  Completed   Hepatitis C Screening  Completed   Zoster Vaccines- Shingrix  Completed   HPV VACCINES  Aged Out   Health Maintenance Review LIFESTYLE:  Exercise:  walk a lot  group 3-4 miles  3-4 x per week.  Tobacco/ETS:n Alcohol: little Sugar beverages: sweet tea.  Regular  Sleep: 7 not a good sleeper .  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    Social History   Socioeconomic History   Marital status: Married    Spouse name: Not on file   Number of  children: Not on file   Years of education: Not on file   Highest education level: Not on file  Occupational History   Occupation: Retired  Tobacco Use   Smoking status: Never   Smokeless tobacco: Never  Vaping Use   Vaping Use: Never used  Substance and Sexual Activity   Alcohol use: Yes    Alcohol/week: 0.0 standard drinks    Comment: seldom   Drug use: No   Sexual activity: Not Currently    Partners: Male    Birth control/protection: Other-see comments    Comment: husband had prostate cancer and BTL  Other Topics Concern   Not on file  Social History Narrative   X-ray tech    Married household with 2 lives with husband and the dog negative ETS   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   Social alcohol no tobacco some caffeine    fa   G2P2   Social Determinants of Health  Financial Resource Strain: Low Risk    Difficulty of Paying Living Expenses: Not hard at all  Food Insecurity: No Food Insecurity   Worried About Charity fundraiser in the Last Year: Never true   Ran Out of Food in the Last Year: Never true  Transportation Needs: No Transportation Needs   Lack of Transportation (Medical): No   Lack of Transportation (Non-Medical): No  Physical Activity: Sufficiently Active   Days of Exercise per Week: 3 days   Minutes of Exercise per Session: 90 min  Stress: No Stress Concern Present   Feeling of Stress : Only a little  Social Connections: Moderately Integrated   Frequency of Communication with Friends and Family: More than three times a week   Frequency of Social Gatherings with Friends and Family: More than three times a week   Attends Religious Services: More than 4 times per year   Active Member of [employer] or Organizations: No   Attends Archivist Meetings: Never   Marital Status: Married    Outpatient Medications Prior to Visit  Medication Sig Dispense Refill   benazepril-hydrochlorthiazide (LOTENSIN HCT)  20-12.5 MG tablet TAKE 1 TABLET BY MOUTH EVERY DAY 90 tablet 0   famotidine (PEPCID) 40 MG tablet Take 40 mg by mouth 2 (two) times daily.     ibuprofen (ADVIL) 200 MG tablet Take 200 mg by mouth as needed.     PFIZER-BIONT COVID-19 VAC-TRIS SUSP injection      polyethylene glycol (MIRALAX / GLYCOLAX) 17 g packet Take 17 g by mouth daily. Every other day     omeprazole (PRILOSEC) 40 MG capsule TAKE 1 CAPSULE 20 MINUTES BEFORE BREAKFAST DAILY  12   No facility-administered medications prior to visit.     EXAM:  BP 130/68 (BP Location: Left Arm, Patient Position: Sitting, Cuff Size: Normal)   Pulse 66   Temp 97.9 F (36.6 C) (Oral)   Ht 5' 3"  (1.6 m)   Wt 207 lb 12.8 oz (94.3 kg)   LMP 06/08/2004   SpO2 97%   BMI 36.81 kg/m   Body mass index is 36.81 kg/m. Wt Readings from Last 3 Encounters:  03/04/21 207 lb 12.8 oz (94.3 kg)  03/01/20 216 lb 12.8 oz (98.3 kg)  02/28/19 217 lb 6.4 oz (98.6 kg)    Physical Exam: Vital signs reviewed FFM:BWGY is a well-developed well-nourished alert cooperative    who appearsr stated age in no acute distress.  HEENT: normocephalic atraumatic , Eyes: PERRL EOM's full, conjunctiva clear, Nares: paten,t no deformity discharge or tenderness., Ears: no deformity EAC's clear TMs with normal landmarks. Mouth: clear OP, no lesions, edema.  Moist mucous membranes. Dentition in adequate repair. NECK: supple without masses, thyromegaly or bruits. CHEST/PULM:  Clear to auscultation and percussion breath sounds equal no wheeze , rales or rhonchi. No chest wall deformities or tenderness. Breast: normal by inspection . No dimpling, discharge, masses, tenderness or discharge . CV: PMI is nondisplaced, S1 S2 no gallops, murmurs, rubs. Peripheral pulses are full without delay.No JVD .  ABDOMEN: Bowel sounds normal nontender  No guard or rebound, no hepato splenomegal no CVA tenderness.   Extremtities:  No clubbing cyanosis or edema, no acute joint swelling or  redness no focal atrophy  Varicose veins  NEURO:  Oriented x3, cranial nerves 3-12 appear to be intact, no obvious focal weakness,gait within normal limits no abnormal reflexes or asymmetrical SKIN: No acute rashes normal turgor, color, no bruising or petechiae. PSYCH: Oriented, good eye  contact, no obvious depression anxiety, cognition and judgment appear normal. LN: no cervical axillary inguinal adenopathy  Lab Results  Component Value Date   WBC 9.6 02/13/2021   HGB 12.8 02/13/2021   HCT 37 02/13/2021   PLT 313 03/01/2020   GLUCOSE 112 (H) 03/04/2021   CHOL 198 03/04/2021   TRIG 96.0 03/04/2021   HDL 63.40 03/04/2021   LDLCALC 115 (H) 03/04/2021   ALT 19 03/01/2020   AST 17 03/01/2020   NA 139 03/04/2021   K 3.5 03/04/2021   CL 101 03/04/2021   CREATININE 0.89 03/04/2021   BUN 21 03/04/2021   CO2 30 03/04/2021   TSH 1.75 03/04/2021   HGBA1C 5.9 03/04/2021   MICROALBUR 0.2 03/23/2014    BP Readings from Last 3 Encounters:  03/04/21 130/68  03/01/20 124/72  02/28/19 136/82    Lab plan  reviewed with patient  reveiwed  dr Collene Mares  bg nf was 157   cbc nl   ASSESSMENT AND PLAN:  Discussed the following assessment and plan:    ICD-10-CM   1. Visit for preventive health examination  Z00.00     2. Medication management  Q59.563 Basic metabolic panel    Hemoglobin A1c    Lipid panel    TSH    TSH    Lipid panel    Hemoglobin O7F    Basic metabolic panel    3. Essential hypertension  I43 Basic metabolic panel    Hemoglobin A1c    Lipid panel    TSH    TSH    Lipid panel    Hemoglobin P2R    Basic metabolic panel   at goal continue    4. Hyperlipidemia, unspecified hyperlipidemia type  J18.8 Basic metabolic panel    Hemoglobin A1c    Lipid panel    TSH    TSH    Lipid panel    Hemoglobin C1Y    Basic metabolic panel    5. Hyperglycemia  S06.3 Basic metabolic panel    Hemoglobin A1c    Lipid panel    TSH    TSH    Lipid panel    Hemoglobin A1c     Basic metabolic panel   in prediabetic range in past     6. Need for immunization against influenza  Z23 Flu Vaccine QUAD High Dose(Fluad)    Diet changes  sugars etc  lab today  and fu as indicated . Continue with weight loss.   Continue meds and vit d at this time  Return for 6-12 months depending .  Patient Care Team: Palmina Clodfelter, Standley Brooking, MD as PCP - General (Internal Medicine) Juanita Craver, MD as Attending Physician (Gastroenterology) Jola Schmidt, MD as Consulting Physician (Ophthalmology) Megan Salon, MD as Consulting Physician (Gynecology) Patient Instructions  Good to see you today . Continue attention  lifestyle interventions.  Reduce or avoid sweet beverages   risk of diabetes and booby traps  weight loss efforts.   Plan follow up depending   Wt Readings from Last 3 Encounters:  03/04/21 207 lb 12.8 oz (94.3 kg)  03/01/20 216 lb 12.8 oz (98.3 kg)  02/28/19 217 lb 6.4 oz (98.6 kg)     Preventive Care 65 Years and Older, Female Preventive care refers to lifestyle choices and visits with your health care provider that can promote health and wellness. This includes: A yearly physical exam. This is also called an annual wellness visit. Regular dental and eye exams. Immunizations. Screening for  certain conditions. Healthy lifestyle choices, such as: Eating a healthy diet. Getting regular exercise. Not using drugs or products that contain nicotine and tobacco. Limiting alcohol use. What can I expect for my preventive care visit? Physical exam Your health care provider will check your: Height and weight. These may be used to calculate your BMI (body mass index). BMI is a measurement that tells if you are at a healthy weight. Heart rate and blood pressure. Body temperature. Skin for abnormal spots. Counseling Your health care provider may ask you questions about your: Past medical problems. Family's medical history. Alcohol, tobacco, and drug use. Emotional  well-being. Home life and relationship well-being. Sexual activity. Diet, exercise, and sleep habits. History of falls. Memory and ability to understand (cognition). Work and work Statistician. Pregnancy and menstrual history. Access to firearms. What immunizations do I need? Vaccines are usually given at various ages, according to a schedule. Your health care provider will recommend vaccines for you based on your age, medical history, and lifestyle or other factors, such as travel or where you work. What tests do I need? Blood tests Lipid and cholesterol levels. These may be checked every 5 years, or more often depending on your overall health. Hepatitis C test. Hepatitis B test. Screening Lung cancer screening. You may have this screening every year starting at age 46 if you have a 30-pack-year history of smoking and currently smoke or have quit within the past 15 years. Colorectal cancer screening. All adults should have this screening starting at age 69 and continuing until age 7. Your health care provider may recommend screening at age 60 if you are at increased risk. You will have tests every 1-10 years, depending on your results and the type of screening test. Diabetes screening. This is done by checking your blood sugar (glucose) after you have not eaten for a while (fasting). You may have this done every 1-3 years. Mammogram. This may be done every 1-2 years. Talk with your health care provider about how often you should have regular mammograms. Abdominal aortic aneurysm (AAA) screening. You may need this if you are a current or former smoker. BRCA-related cancer screening. This may be done if you have a family history of breast, ovarian, tubal, or peritoneal cancers. Other tests STD (sexually transmitted disease) testing, if you are at risk. Bone density scan. This is done to screen for osteoporosis. You may have this done starting at age 29. Talk with your health care  provider about your test results, treatment options, and if necessary, the need for more tests. Follow these instructions at home: Eating and drinking  Eat a diet that includes fresh fruits and vegetables, whole grains, lean protein, and low-fat dairy products. Limit your intake of foods with high amounts of sugar, saturated fats, and salt. Take vitamin and mineral supplements as recommended by your health care provider. Do not drink alcohol if your health care provider tells you not to drink. If you drink alcohol: Limit how much you have to 0-1 drink a day. Be aware of how much alcohol is in your drink. In the [place] one drink equals one 12 oz bottle of beer (355 mL), one 5 oz glass of wine (148 mL), or one 1 oz glass of hard liquor (44 mL). Lifestyle Take daily care of your teeth and gums. Brush your teeth every morning and night with fluoride toothpaste. Floss one time each day. Stay active. Exercise for at least 30 minutes 5 or more days each week. Do  not use any products that contain nicotine or tobacco, such as cigarettes, e-cigarettes, and chewing tobacco. If you need help quitting, ask your health care provider. Do not use drugs. If you are sexually active, practice safe sex. Use a condom or other form of protection in order to prevent STIs (sexually transmitted infections). Talk with your health care provider about taking a low-dose aspirin or statin. Find healthy ways to cope with stress, such as: Meditation, yoga, or listening to music. Journaling. Talking to a trusted person. Spending time with friends and family. Safety Always wear your seat belt while driving or riding in a vehicle. Do not drive: If you have been drinking alcohol. Do not ride with someone who has been drinking. When you are tired or distracted. While texting. Wear a helmet and other protective equipment during sports activities. If you have firearms in your house, make sure you follow all gun safety  procedures. What's next? Visit your health care provider once a year for an annual wellness visit. Ask your health care provider how often you should have your eyes and teeth checked. Stay up to date on all vaccines. This information is not intended to replace advice given to you by your health care provider. Make sure you discuss any questions you have with your health care provider. Document Revised: 08/02/2020 Document Reviewed: 05/19/2018 Elsevier Patient Education  2022 [employer]. Saranne Crislip M.D.

## 2021-03-04 ENCOUNTER — Other Ambulatory Visit: Payer: Self-pay

## 2021-03-04 ENCOUNTER — Encounter: Payer: Self-pay | Admitting: Internal Medicine

## 2021-03-04 ENCOUNTER — Ambulatory Visit (INDEPENDENT_AMBULATORY_CARE_PROVIDER_SITE_OTHER): Payer: Medicare HMO | Admitting: Internal Medicine

## 2021-03-04 VITALS — BP 130/68 | HR 66 | Temp 97.9°F | Ht 63.0 in | Wt 207.8 lb

## 2021-03-04 DIAGNOSIS — I10 Essential (primary) hypertension: Secondary | ICD-10-CM | POA: Diagnosis not present

## 2021-03-04 DIAGNOSIS — Z23 Encounter for immunization: Secondary | ICD-10-CM | POA: Diagnosis not present

## 2021-03-04 DIAGNOSIS — Z79899 Other long term (current) drug therapy: Secondary | ICD-10-CM

## 2021-03-04 DIAGNOSIS — R739 Hyperglycemia, unspecified: Secondary | ICD-10-CM

## 2021-03-04 DIAGNOSIS — E785 Hyperlipidemia, unspecified: Secondary | ICD-10-CM | POA: Diagnosis not present

## 2021-03-04 DIAGNOSIS — Z0000 Encounter for general adult medical examination without abnormal findings: Secondary | ICD-10-CM

## 2021-03-04 LAB — BASIC METABOLIC PANEL
BUN: 21 mg/dL (ref 6–23)
CO2: 30 mEq/L (ref 19–32)
Calcium: 9.2 mg/dL (ref 8.4–10.5)
Chloride: 101 mEq/L (ref 96–112)
Creatinine, Ser: 0.89 mg/dL (ref 0.40–1.20)
GFR: 66.2 mL/min (ref >60.00)
Glucose, Bld: 112 mg/dL — ABNORMAL HIGH (ref 70–99)
Potassium: 3.5 mEq/L (ref 3.5–5.1)
Sodium: 139 mEq/L (ref 135–145)

## 2021-03-04 LAB — LIPID PANEL
Cholesterol: 198 mg/dL (ref 0–200)
HDL: 63.4 mg/dL (ref >39.00)
LDL Cholesterol: 115 mg/dL — ABNORMAL HIGH (ref 0–99)
NonHDL: 134.26
Total CHOL/HDL Ratio: 3
Triglycerides: 96 mg/dL (ref 0.0–149.0)
VLDL: 19.2 mg/dL (ref 0.0–40.0)

## 2021-03-04 LAB — HEMOGLOBIN A1C: Hgb A1c MFr Bld: 5.9 % (ref 4.6–6.5)

## 2021-03-04 LAB — TSH: TSH: 1.75 u[IU]/mL (ref 0.35–5.50)

## 2021-03-04 NOTE — Patient Instructions (Addendum)
Good to see you today . Continue attention  lifestyle interventions.  Reduce or avoid sweet beverages   risk of diabetes and booby traps  weight loss efforts.   Plan follow up depending   Wt Readings from Last 3 Encounters:  03/04/21 207 lb 12.8 oz (94.3 kg)  03/01/20 216 lb 12.8 oz (98.3 kg)  02/28/19 217 lb 6.4 oz (98.6 kg)     Preventive Care 69 Years and Older, Female Preventive care refers to lifestyle choices and visits with your health care provider that can promote health and wellness. This includes: A yearly physical exam. This is also called an annual wellness visit. Regular dental and eye exams. Immunizations. Screening for certain conditions. Healthy lifestyle choices, such as: Eating a healthy diet. Getting regular exercise. Not using drugs or products that contain nicotine and tobacco. Limiting alcohol use. What can I expect for my preventive care visit? Physical exam Your health care provider will check your: Height and weight. These may be used to calculate your BMI (body mass index). BMI is a measurement that tells if you are at a healthy weight. Heart rate and blood pressure. Body temperature. Skin for abnormal spots. Counseling Your health care provider may ask you questions about your: Past medical problems. Family's medical history. Alcohol, tobacco, and drug use. Emotional well-being. Home life and relationship well-being. Sexual activity. Diet, exercise, and sleep habits. History of falls. Memory and ability to understand (cognition). Work and work Statistician. Pregnancy and menstrual history. Access to firearms. What immunizations do I need? Vaccines are usually given at various ages, according to a schedule. Your health care provider will recommend vaccines for you based on your age, medical history, and lifestyle or other factors, such as travel or where you work. What tests do I need? Blood tests Lipid and cholesterol levels. These may be  checked every 5 years, or more often depending on your overall health. Hepatitis C test. Hepatitis B test. Screening Lung cancer screening. You may have this screening every year starting at age 28 if you have a 30-pack-year history of smoking and currently smoke or have quit within the past 15 years. Colorectal cancer screening. All adults should have this screening starting at age 37 and continuing until age 46. Your health care provider may recommend screening at age 24 if you are at increased risk. You will have tests every 1-10 years, depending on your results and the type of screening test. Diabetes screening. This is done by checking your blood sugar (glucose) after you have not eaten for a while (fasting). You may have this done every 1-3 years. Mammogram. This may be done every 1-2 years. Talk with your health care provider about how often you should have regular mammograms. Abdominal aortic aneurysm (AAA) screening. You may need this if you are a current or former smoker. BRCA-related cancer screening. This may be done if you have a family history of breast, ovarian, tubal, or peritoneal cancers. Other tests STD (sexually transmitted disease) testing, if you are at risk. Bone density scan. This is done to screen for osteoporosis. You may have this done starting at age 73. Talk with your health care provider about your test results, treatment options, and if necessary, the need for more tests. Follow these instructions at home: Eating and drinking  Eat a diet that includes fresh fruits and vegetables, whole grains, lean protein, and low-fat dairy products. Limit your intake of foods with high amounts of sugar, saturated fats, and salt. Take vitamin and  mineral supplements as recommended by your health care provider. Do not drink alcohol if your health care provider tells you not to drink. If you drink alcohol: Limit how much you have to 0-1 drink a day. Be aware of how much  alcohol is in your drink. In the [place] one drink equals one 12 oz bottle of beer (355 mL), one 5 oz glass of wine (148 mL), or one 1 oz glass of hard liquor (44 mL). Lifestyle Take daily care of your teeth and gums. Brush your teeth every morning and night with fluoride toothpaste. Floss one time each day. Stay active. Exercise for at least 30 minutes 5 or more days each week. Do not use any products that contain nicotine or tobacco, such as cigarettes, e-cigarettes, and chewing tobacco. If you need help quitting, ask your health care provider. Do not use drugs. If you are sexually active, practice safe sex. Use a condom or other form of protection in order to prevent STIs (sexually transmitted infections). Talk with your health care provider about taking a low-dose aspirin or statin. Find healthy ways to cope with stress, such as: Meditation, yoga, or listening to music. Journaling. Talking to a trusted person. Spending time with friends and family. Safety Always wear your seat belt while driving or riding in a vehicle. Do not drive: If you have been drinking alcohol. Do not ride with someone who has been drinking. When you are tired or distracted. While texting. Wear a helmet and other protective equipment during sports activities. If you have firearms in your house, make sure you follow all gun safety procedures. What's next? Visit your health care provider once a year for an annual wellness visit. Ask your health care provider how often you should have your eyes and teeth checked. Stay up to date on all vaccines. This information is not intended to replace advice given to you by your health care provider. Make sure you discuss any questions you have with your health care provider. Document Revised: 08/02/2020 Document Reviewed: 05/19/2018 Elsevier Patient Education  2022 [employer].

## 2021-03-04 NOTE — Progress Notes (Signed)
Blood sugar is borderline prediabetic but A1c is slightly better than last time 5.9 as opposed to 6 Continue intensification lifestyle cutting out sweet drinks to prevent diabetes. Thyroid and kidney function are normal

## 2021-03-14 ENCOUNTER — Other Ambulatory Visit: Payer: Self-pay | Admitting: Internal Medicine

## 2021-09-04 ENCOUNTER — Other Ambulatory Visit: Payer: Self-pay | Admitting: Internal Medicine

## 2021-12-16 DIAGNOSIS — M25561 Pain in right knee: Secondary | ICD-10-CM | POA: Diagnosis not present

## 2022-01-01 ENCOUNTER — Other Ambulatory Visit: Payer: Self-pay | Admitting: Internal Medicine

## 2022-01-01 DIAGNOSIS — Z1231 Encounter for screening mammogram for malignant neoplasm of breast: Secondary | ICD-10-CM

## 2022-01-20 ENCOUNTER — Ambulatory Visit
Admission: RE | Admit: 2022-01-20 | Discharge: 2022-01-20 | Disposition: A | Discharge status: Home / Self Care | Payer: Medicare HMO | Source: Ambulatory Visit | Attending: Internal Medicine | Admitting: Internal Medicine

## 2022-01-20 DIAGNOSIS — Z1231 Encounter for screening mammogram for malignant neoplasm of breast: Secondary | ICD-10-CM | POA: Diagnosis not present

## 2022-01-30 DIAGNOSIS — H5201 Hypermetropia, right eye: Secondary | ICD-10-CM | POA: Diagnosis not present

## 2022-01-30 DIAGNOSIS — H2513 Age-related nuclear cataract, bilateral: Secondary | ICD-10-CM | POA: Diagnosis not present

## 2022-02-18 ENCOUNTER — Ambulatory Visit: Payer: Medicare HMO

## 2022-02-18 ENCOUNTER — Ambulatory Visit (INDEPENDENT_AMBULATORY_CARE_PROVIDER_SITE_OTHER): Payer: Medicare HMO

## 2022-02-18 VITALS — Ht 63.0 in | Wt 203.0 lb

## 2022-02-18 DIAGNOSIS — Z0000 Encounter for general adult medical examination without abnormal findings: Secondary | ICD-10-CM | POA: Diagnosis not present

## 2022-02-18 NOTE — Patient Instructions (Addendum)
[name] , Thank you for taking time to come for your Medicare Wellness Visit. I appreciate your ongoing commitment to your health goals. Please review the following plan we discussed and let me know if I can assist you in the future.   Screening recommendations/referrals: Colonoscopy: Done 10/16/16 Repeat 10 Yr Mammogram: Done 01/20/22 Repeat 1 Yr Bone Density: Done Recommended yearly ophthalmology/optometry visit for glaucoma screening and checkup Recommended yearly dental visit for hygiene and checkup  Vaccinations: Influenza vaccine: Up to date Pneumococcal vaccine: Up to date Tdap vaccine: Due Shingles vaccine: Done   Covid-19:Done  Advanced directives: Advance directive discussed with you today. Even though you declined this today, please call our office should you change your mind, and we can give you the proper paperwork for you to fill out.   Conditions/risks identified: None  Next appointment: Follow up in one year for your annual wellness visit    Preventive Care 70 Years and Older, Female Preventive care refers to lifestyle choices and visits with your health care provider that can promote health and wellness. What does preventive care include? A yearly physical exam. This is also called an annual well check. Dental exams once or twice a year. Routine eye exams. Ask your health care provider how often you should have your eyes checked. Personal lifestyle choices, including: Daily care of your teeth and gums. Regular physical activity. Eating a healthy diet. Avoiding tobacco and drug use. Limiting alcohol use. Practicing safe sex. Taking low-dose aspirin every day. Taking vitamin and mineral supplements as recommended by your health care provider. What happens during an annual well check? The services and screenings done by your health care provider during your annual well check will depend on your age, overall health, lifestyle risk factors, and family history of  disease. Counseling  Your health care provider may ask you questions about your: Alcohol use. Tobacco use. Drug use. Emotional well-being. Home and relationship well-being. Sexual activity. Eating habits. History of falls. Memory and ability to understand (cognition). Work and work Statistician. Reproductive health. Screening  You may have the following tests or measurements: Height, weight, and BMI. Blood pressure. Lipid and cholesterol levels. These may be checked every 5 years, or more frequently if you are over 33 years old. Skin check. Lung cancer screening. You may have this screening every year starting at age 32 if you have a 30-pack-year history of smoking and currently smoke or have quit within the past 15 years. Fecal occult blood test (FOBT) of the stool. You may have this test every year starting at age 77. Flexible sigmoidoscopy or colonoscopy. You may have a sigmoidoscopy every 5 years or a colonoscopy every 10 years starting at age 45. Hepatitis C blood test. Hepatitis B blood test. Sexually transmitted disease (STD) testing. Diabetes screening. This is done by checking your blood sugar (glucose) after you have not eaten for a while (fasting). You may have this done every 1-3 years. Bone density scan. This is done to screen for osteoporosis. You may have this done starting at age 1. Mammogram. This may be done every 1-2 years. Talk to your health care provider about how often you should have regular mammograms. Talk with your health care provider about your test results, treatment options, and if necessary, the need for more tests. Vaccines  Your health care provider may recommend certain vaccines, such as: Influenza vaccine. This is recommended every year. Tetanus, diphtheria, and acellular pertussis (Tdap, Td) vaccine. You may need a Td booster every  10 years. Zoster vaccine. You may need this after age 27. Pneumococcal 13-valent conjugate (PCV13) vaccine. One  dose is recommended after age 62. Pneumococcal polysaccharide (PPSV23) vaccine. One dose is recommended after age 26. Talk to your health care provider about which screenings and vaccines you need and how often you need them. This information is not intended to replace advice given to you by your health care provider. Make sure you discuss any questions you have with your health care provider. Document Released: 06/21/2015 Document Revised: 02/12/2016 Document Reviewed: 03/26/2015 Elsevier Interactive Patient Education  2017 [place] Prevention in the Home Falls can cause injuries. They can happen to people of all ages. There are many things you can do to make your home safe and to help prevent falls. What can I do on the outside of my home? Regularly fix the edges of walkways and driveways and fix any cracks. Remove anything that might make you trip as you walk through a door, such as a raised step or threshold. Trim any bushes or trees on the path to your home. Use bright outdoor lighting. Clear any walking paths of anything that might make someone trip, such as rocks or tools. Regularly check to see if handrails are loose or broken. Make sure that both sides of any steps have handrails. Any raised decks and porches should have guardrails on the edges. Have any leaves, snow, or ice cleared regularly. Use sand or salt on walking paths during winter. Clean up any spills in your garage right away. This includes oil or grease spills. What can I do in the bathroom? Use night lights. Install grab bars by the toilet and in the tub and shower. Do not use towel bars as grab bars. Use non-skid mats or decals in the tub or shower. If you need to sit down in the shower, use a plastic, non-slip stool. Keep the floor dry. Clean up any water that spills on the floor as soon as it happens. Remove soap buildup in the tub or shower regularly. Attach bath mats securely with double-sided  non-slip rug tape. Do not have throw rugs and other things on the floor that can make you trip. What can I do in the bedroom? Use night lights. Make sure that you have a light by your bed that is easy to reach. Do not use any sheets or blankets that are too big for your bed. They should not hang down onto the floor. Have a firm chair that has side arms. You can use this for support while you get dressed. Do not have throw rugs and other things on the floor that can make you trip. What can I do in the kitchen? Clean up any spills right away. Avoid walking on wet floors. Keep items that you use a lot in easy-to-reach places. If you need to reach something above you, use a strong step stool that has a grab bar. Keep electrical cords out of the way. Do not use floor polish or wax that makes floors slippery. If you must use wax, use non-skid floor wax. Do not have throw rugs and other things on the floor that can make you trip. What can I do with my stairs? Do not leave any items on the stairs. Make sure that there are handrails on both sides of the stairs and use them. Fix handrails that are broken or loose. Make sure that handrails are as long as the stairways. Check any carpeting to make  sure that it is firmly attached to the stairs. Fix any carpet that is loose or worn. Avoid having throw rugs at the top or bottom of the stairs. If you do have throw rugs, attach them to the floor with carpet tape. Make sure that you have a light switch at the top of the stairs and the bottom of the stairs. If you do not have them, ask someone to add them for you. What else can I do to help prevent falls? Wear shoes that: Do not have high heels. Have rubber bottoms. Are comfortable and fit you well. Are closed at the toe. Do not wear sandals. If you use a stepladder: Make sure that it is fully opened. Do not climb a closed stepladder. Make sure that both sides of the stepladder are locked into place. Ask  someone to hold it for you, if possible. Clearly mark and make sure that you can see: Any grab bars or handrails. First and last steps. Where the edge of each step is. Use tools that help you move around (mobility aids) if they are needed. These include: Canes. Walkers. Scooters. Crutches. Turn on the lights when you go into a dark area. Replace any light bulbs as soon as they burn out. Set up your furniture so you have a clear path. Avoid moving your furniture around. If any of your floors are uneven, fix them. If there are any pets around you, be aware of where they are. Review your medicines with your doctor. Some medicines can make you feel dizzy. This can increase your chance of falling. Ask your doctor what other things that you can do to help prevent falls. This information is not intended to replace advice given to you by your health care provider. Make sure you discuss any questions you have with your health care provider. Document Released: 03/21/2009 Document Revised: 10/31/2015 Document Reviewed: 06/29/2014 Elsevier Interactive Patient Education  2017 [employer].

## 2022-02-18 NOTE — Progress Notes (Signed)
Subjective:   [name] is a 71 y.o. female who presents for Medicare Annual (Subsequent) preventive examination.  Review of Systems    Virtual Visit via Telephone Note  I connected with  [name] on 02/18/22 at  9:15 AM EDT by telephone and verified that I am speaking with the correct person using two identifiers.  Location: Patient: Home Provider: Office Persons participating in the virtual visit: patient/Nurse Health Advisor   I discussed the limitations, risks, security and privacy concerns of performing an evaluation and management service by telephone and the availability of in person appointments. The patient expressed understanding and agreed to proceed.  Interactive audio and video telecommunications were attempted between this nurse and patient, however failed, due to patient having technical difficulties OR patient did not have access to video capability.  We continued and completed visit with audio only.  Some vital signs may be absent or patient reported.   Criselda Peaches, LPN  Cardiac Risk Factors include: advanced age (>49mn, >>10women);hypertension     Objective:    Today's Vitals   02/18/22 0920  Weight: 203 lb (92.1 kg)  Height: '5\' 3"'$  (1.6 m)   Body mass index is 35.96 kg/m.     02/18/2022    9:26 AM 01/29/2021    9:39 AM 01/24/2020    9:36 AM 10/23/2013   10:50 AM  Advanced Directives  Does Patient Have a Medical Advance Directive? No Yes No Patient does not have advance directive  Does patient want to make changes to medical advance directive?  Yes (MAU/Ambulatory/Procedural Areas - Information given)    Would patient like information on creating a medical advance directive? No - Patient declined  Yes (MAU/Ambulatory/Procedural Areas - Information given)     Current Medications (verified) Outpatient Encounter Medications as of 02/18/2022  Medication Sig   benazepril-hydrochlorthiazide (LOTENSIN HCT) 20-12.5 MG tablet TAKE 1 TABLET BY  MOUTH EVERY DAY   famotidine (PEPCID) 40 MG tablet Take 40 mg by mouth 2 (two) times daily.   ibuprofen (ADVIL) 200 MG tablet Take 200 mg by mouth as needed.   PFIZER-BIONT COVID-19 VAC-TRIS SUSP injection    polyethylene glycol (MIRALAX / GLYCOLAX) 17 g packet Take 17 g by mouth daily. Every other day   No facility-administered encounter medications on file as of 02/18/2022.    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   Social History   Socioeconomic History   Marital status: Married    Spouse name: Not on file   Number of children:  Not on file   Years of education: Not on file   Highest education level: Not on file  Occupational History   Occupation: Retired  Tobacco Use   Smoking status: Never   Smokeless tobacco: Never  Vaping Use   Vaping Use: Never used  Substance and Sexual Activity   Alcohol use: Yes    Alcohol/week: 0.0 standard drinks of alcohol    Comment: seldom   Drug use: No   Sexual activity: Not Currently    Partners: Male    Birth control/protection: Other-see comments    Comment: husband had prostate cancer and BTL  Other Topics Concern   Not on file  Social History Narrative   X-ray tech    Married household with 2 lives  with husband and the dog negative ETS   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   Social alcohol no tobacco some caffeine    fa   G2P2   Social Determinants of Health   Financial Resource Strain: Low Risk  (02/18/2022)   Overall Financial Resource Strain (CARDIA)    Difficulty of Paying Living Expenses: Not hard at all  Food Insecurity: No Food Insecurity (02/18/2022)   Hunger Vital Sign    Worried About [place] in the Last Year: Never true    [place] in the Last Year: Never true  Transportation Needs: No Transportation Needs (02/18/2022)   PRAPARE - Hydrologist (Medical): No    Lack of Transportation (Non-Medical): No  Physical Activity: Sufficiently Active (02/18/2022)   Exercise Vital Sign    Days of Exercise per Week: 3 days    Minutes of Exercise per Session: 70 min  Stress: No Stress Concern Present (02/18/2022)   [place]    Feeling of Stress : Not at all  Social Connections: [place] (02/18/2022)   Social Connection and Isolation Panel [NHANES]    Frequency of Communication with Friends and Family: More than three times a week    Frequency of Social Gatherings with Friends and Family: More than three times a week    Attends Religious Services: More than 4 times per year    Active Member of [employer] or Organizations: Yes    Attends Music therapist: More than 4 times per year    Marital Status: Married    Tobacco Counseling Counseling given: Not Answered   Clinical Intake:  Pre-visit preparation completed: No  Pain : No/denies pain     BMI - recorded: 35.96 Nutritional Status: BMI > 30  Obese Nutritional Risks: None Diabetes: No  How often do you need to have someone help you when you read instructions, pamphlets, or other written materials from your doctor or pharmacy?: 1 - Never  Diabetic?   NO  Interpreter Needed?: No  Information entered by :: Rolene Arbour LPN   Activities of Daily Living    02/18/2022    9:25 AM  In your present state of health, do you have any difficulty performing the following activities:  Hearing? 0  Vision? 0  Difficulty concentrating or making decisions? 0  Walking or climbing stairs? 0  Dressing or bathing? 0  Doing errands, shopping? 0  Preparing Food and eating ? N  Using the Toilet? N  In the past six months, have you accidently leaked urine? N  Do you have problems with loss of  bowel control? N  Managing your Medications? N  Managing your Finances? N  Housekeeping or managing your Housekeeping? N    Patient Care Team: Panosh, Standley Brooking, MD as PCP - General (Internal Medicine) Juanita Craver, MD as Attending Physician (Gastroenterology) Jola Schmidt, MD as Consulting Physician (Ophthalmology) Megan Salon, MD as Consulting Physician (Gynecology)  Indicate any recent Medical Services you may have received from other than [facility] providers in the past year (date may be approximate).     Assessment:   This is a routine wellness examination for [facility].  Hearing/Vision screen Hearing Screening - Comments:: Denies hearing difficulties   Vision Screening - Comments:: Wears rx glasses - up to date with routine eye exams with  Dr Valetta Close  Dietary issues and exercise activities discussed: Current Exercise Habits: Home exercise routine, Type of exercise: walking, Time (Minutes): > 60, Frequency (Times/Week): 3, Weekly Exercise (Minutes/Week): 0, Intensity: Moderate, Exercise limited by: None identified   Goals Addressed               This Visit's Progress     No current goals (pt-stated)         Depression Screen    02/18/2022    9:24 AM 01/29/2021    9:37 AM 01/24/2020    9:35 AM 02/28/2019    8:55 AM 02/28/2018   10:00 AM 01/26/2017    8:20 AM 08/29/2013    6:55 PM  PHQ 2/9 Scores  PHQ - 2 Score 0 0 0 0 0 0 0    Fall  Risk    02/18/2022    9:26 AM 02/15/2022    8:10 AM 01/29/2021    9:40 AM 01/24/2020    9:38 AM 02/28/2018   10:00 AM  [place] in the past year? 0 0 0 0 No  Number falls in past yr: 0  0 0   Injury with Fall? 0  0 0   Risk for fall due to : No Fall Risks  Impaired vision Impaired vision   Follow up Falls prevention discussed  Falls prevention discussed      FALL RISK PREVENTION PERTAINING TO THE HOME:  Any stairs in or around the home? Yes  If so, are there any without handrails? No  Home free of loose throw rugs in walkways, pet beds, electrical cords, etc? Yes  Adequate lighting in your home to reduce risk of falls? Yes   ASSISTIVE DEVICES UTILIZED TO PREVENT FALLS:  Life alert? No  Use of a cane, walker or w/c? No  Grab bars in the bathroom? Yes  Shower chair or bench in shower? Yes  Elevated toilet seat or a handicapped toilet? No   TIMED UP AND GO:  Was the test performed? No . Audio Visit   Cognitive Function:        02/18/2022    9:26 AM 01/29/2021    9:42 AM 01/24/2020    9:42 AM  6CIT Screen  What Year? 0 points 0 points 0 points  What month? 0 points 0 points 0 points  What time? 0 points 0 points   Count back from 20 0 points 0 points 0 points  Months in reverse 0 points 0 points 0 points  Repeat phrase 0 points 0 points 0 points  Total Score 0 points 0 points     Immunizations Immunization History  Administered Date(s) Administered   Fluad Quad(high Dose 65+) 03/01/2020, 03/04/2021   Influenza Split 03/08/2013   Influenza, High  Dose Seasonal PF 02/02/2019   Influenza-Unspecified 03/30/2014   PFIZER Comirnaty(Gray Top)Covid-19 Tri-Sucrose Vaccine 10/07/2020   PFIZER(Purple Top)SARS-COV-2 Vaccination 06/29/2019, 07/17/2019, 02/07/2020   Pfizer Covid-19 Vaccine Bivalent Booster 57yr & up 02/27/2021   Pneumococcal Conjugate-13 01/26/2017   Pneumococcal Polysaccharide-23 02/28/2018   Tdap 06/09/2007   Zoster Recombinat (Shingrix)  11/25/2017, 08/13/2018    TDAP status: Due, Education has been provided regarding the importance of this vaccine. Advised may receive this vaccine at local pharmacy or Health Dept. Aware to provide a copy of the vaccination record if obtained from local pharmacy or Health Dept. Verbalized acceptance and understanding.  Flu Vaccine status: Up to date  Pneumococcal vaccine status: Up to date  Covid-19 vaccine status: Completed vaccines  Qualifies for Shingles Vaccine? Yes   Zostavax completed Yes   Shingrix Completed?: Yes  Screening Tests Health Maintenance  Topic Date Due   HEMOGLOBIN A1C  09/01/2021   COVID-19 Vaccine (6 - Pfizer risk series) 03/06/2022 (Originally 04/24/2021)   INFLUENZA VACCINE  09/06/2022 (Originally 01/06/2022)   TETANUS/TDAP  02/19/2023 (Originally 06/08/2017)   MAMMOGRAM  01/21/2024   COLONOSCOPY (Pts 45-461yrInsurance coverage will need to be confirmed)  11/17/2026   Pneumonia Vaccine 6553Years old  Completed   DEXA SCAN  Completed   Hepatitis C Screening  Completed   Zoster Vaccines- Shingrix  Completed   HPV VACCINES  Aged Out    Health Maintenance  Health Maintenance Due  Topic Date Due   HEMOGLOBIN A1C  09/01/2021    Colorectal cancer screening: Type of screening: Colonoscopy. Completed 10/16/16. Repeat every 10 years  Mammogram status: Completed 01/20/22. Repeat every year  Bone Density status: Completed 06/12/19. Results reflect: Bone density results: OSTEOPOROSIS. Repeat every   years.  Lung Cancer Screening: (Low Dose CT Chest recommended if Age 70-80ears, 30 pack-year currently smoking OR have quit w/in 15years.)  qualify.     Additional Screening:  Hepatitis C Screening: does qualify; Completed 02/28/18  Vision Screening: Recommended annual ophthalmology exams for early detection of glaucoma and other disorders of the eye. Is the patient up to date with their annual eye exam?  Yes  Who is the provider or what is the name of the office  in which the patient attends annual eye exams? Dr BoValetta Closef pt is not established with a provider, would they like to be referred to a provider to establish care? No .   Dental Screening: Recommended annual dental exams for proper oral hygiene  Community Resource Referral / Chronic Care Management: CRR required this visit?  No   CCM required this visit?  No      Plan:     I have personally reviewed and noted the following in the patient's chart:   Medical and social history Use of alcohol, tobacco or illicit drugs  Current medications and supplements including opioid prescriptions. Patient is not currently taking opioid prescriptions. Functional ability and status Nutritional status Physical activity Advanced directives List of other physicians Hospitalizations, surgeries, and ER visits in previous 12 months Vitals Screenings to include cognitive, depression, and falls Referrals and appointments  In addition, I have reviewed and discussed with patient certain preventive protocols, quality metrics, and best practice recommendations. A written personalized care plan for preventive services as well as general preventive health recommendations were provided to patient.     BeCriselda PeachesLPN   02/09/61/7035 Nurse Notes: Patient due labs Hemoglobin A1C

## 2022-02-19 ENCOUNTER — Other Ambulatory Visit: Payer: Self-pay | Admitting: Internal Medicine

## 2022-02-26 DIAGNOSIS — R140 Abdominal distension (gaseous): Secondary | ICD-10-CM | POA: Diagnosis not present

## 2022-02-26 DIAGNOSIS — K219 Gastro-esophageal reflux disease without esophagitis: Secondary | ICD-10-CM | POA: Diagnosis not present

## 2022-02-26 DIAGNOSIS — K5730 Diverticulosis of large intestine without perforation or abscess without bleeding: Secondary | ICD-10-CM | POA: Diagnosis not present

## 2022-02-26 DIAGNOSIS — I10 Essential (primary) hypertension: Secondary | ICD-10-CM | POA: Diagnosis not present

## 2022-03-10 ENCOUNTER — Encounter: Payer: Medicare HMO | Admitting: Internal Medicine

## 2022-03-17 IMAGING — MG MM DIGITAL SCREENING BILAT W/ TOMO AND CAD
8 series · 8 of 24 positions shown · non-contrast
Comparison: Previous exam(s).

ACR Breast Density Category a: The breast tissue is almost entirely
fatty.

CLINICAL DATA: Screening.

EXAM:
DIGITAL SCREENING BILATERAL MAMMOGRAM WITH TOMOSYNTHESIS AND CAD
TECHNIQUE: Bilateral screening digital craniocaudal and mediolateral oblique
mammograms were obtained. Bilateral screening digital breast
tomosynthesis was performed. The images were evaluated with
computer-aided detection.

[R MLO synth-2D]
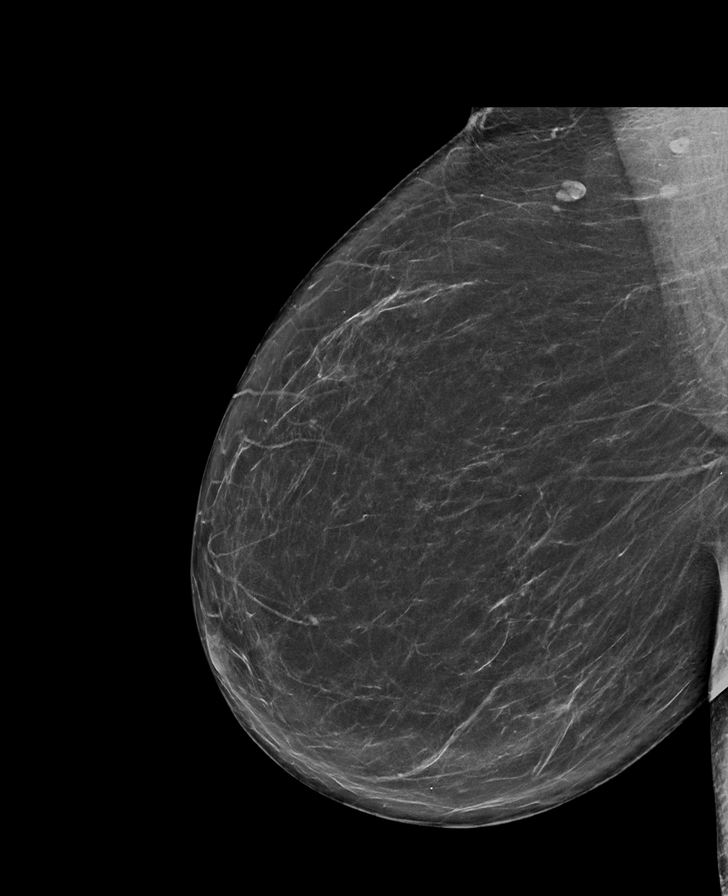

[L CC synth-2D]
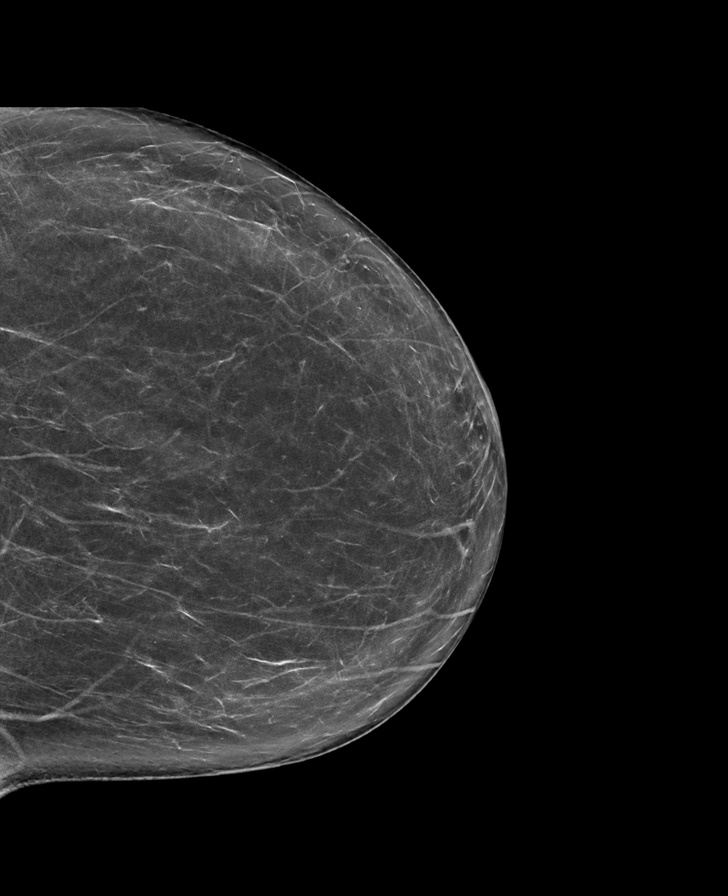

[R CC synth-2D]
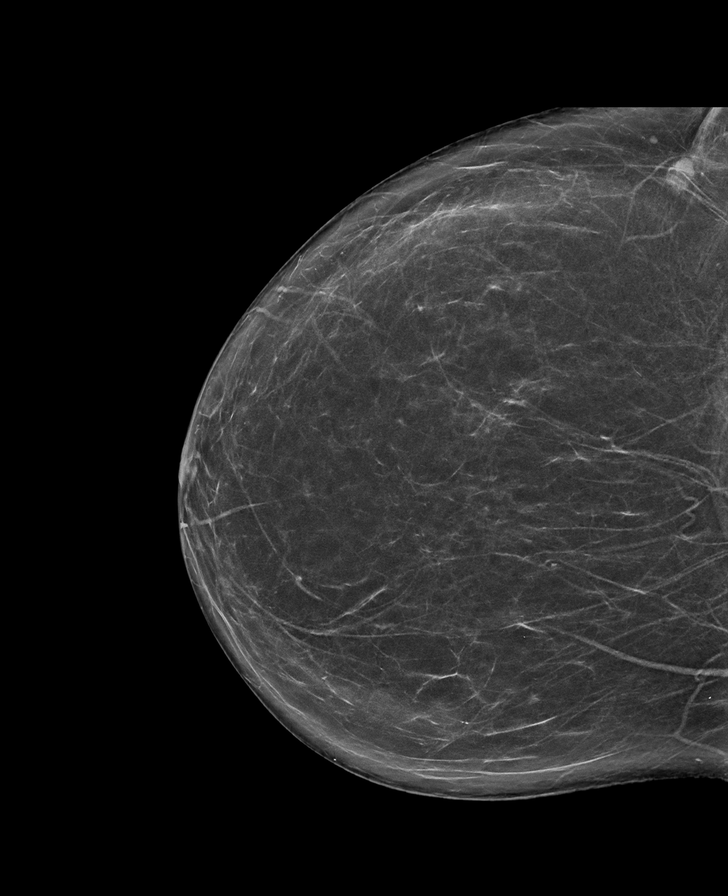

[L MLO synth-2D]
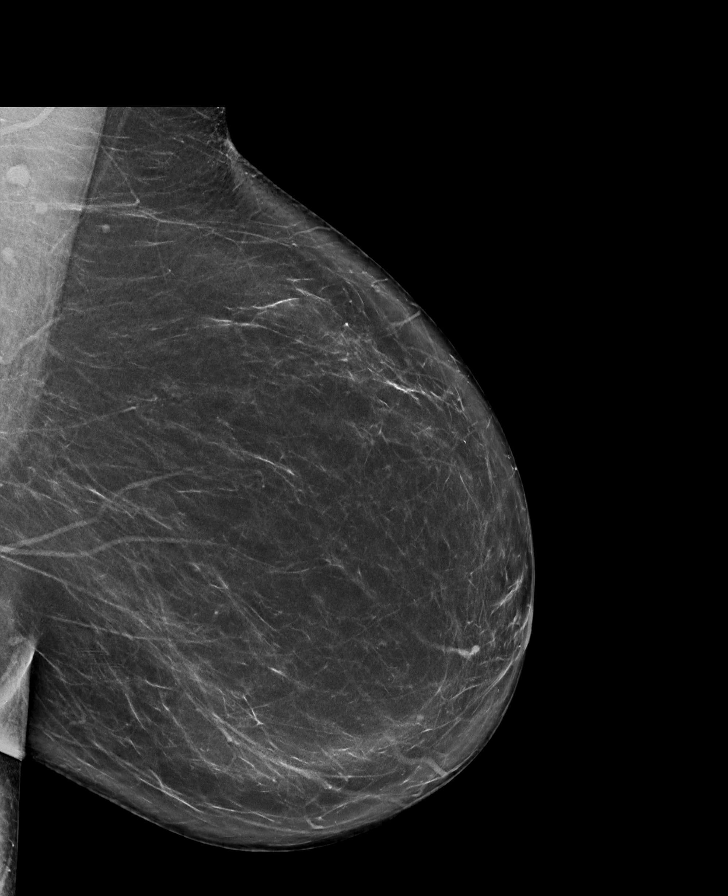

[R MLO tomo · tomo slice 41/80.0]
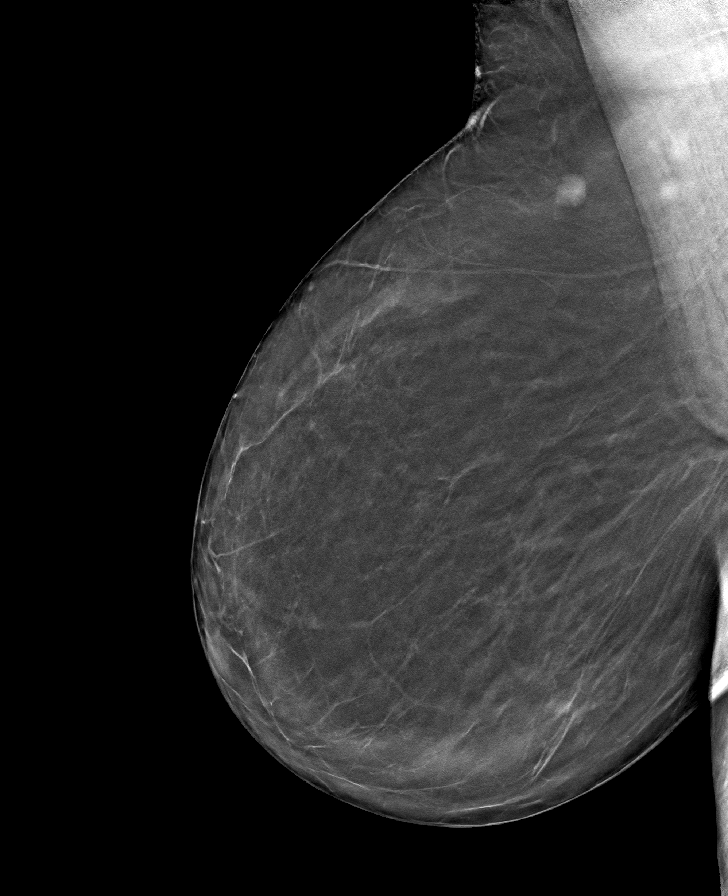

[R CC tomo · tomo slice 37/73.0]
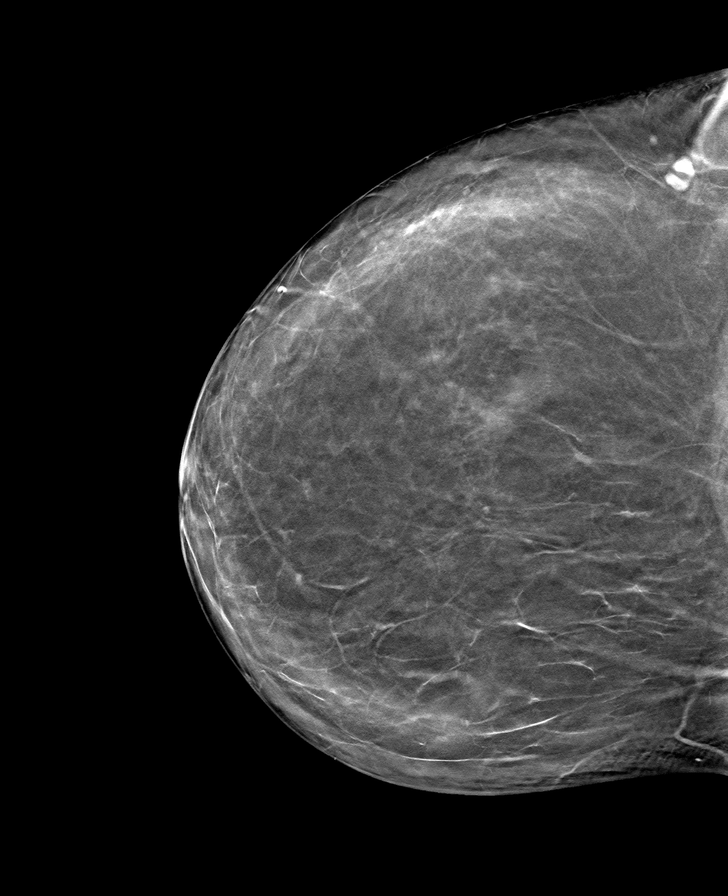

[L MLO tomo · tomo slice 41/82.0]
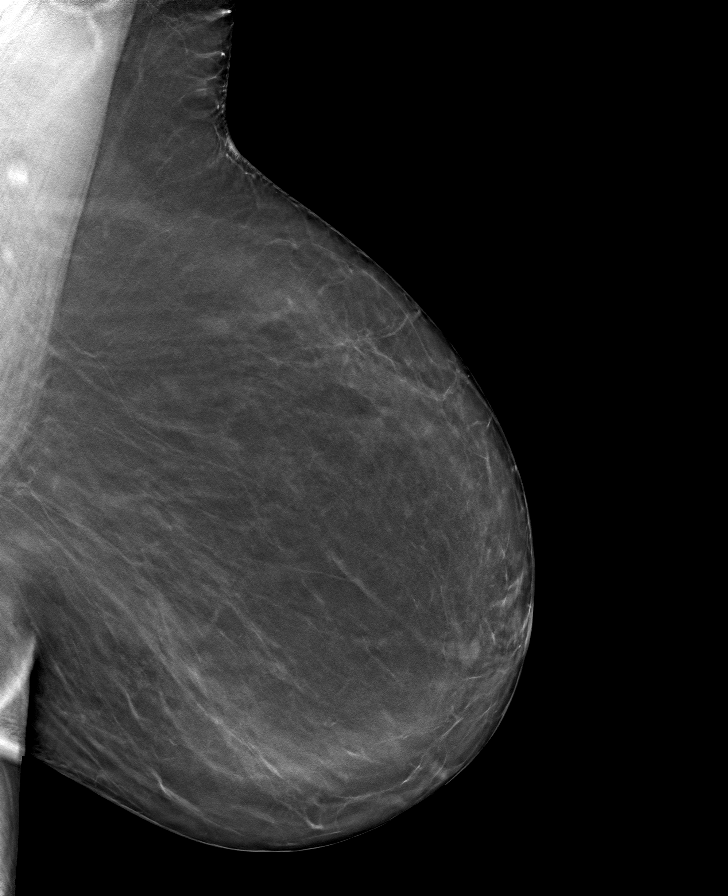

[L CC tomo · tomo slice 39/76.0]
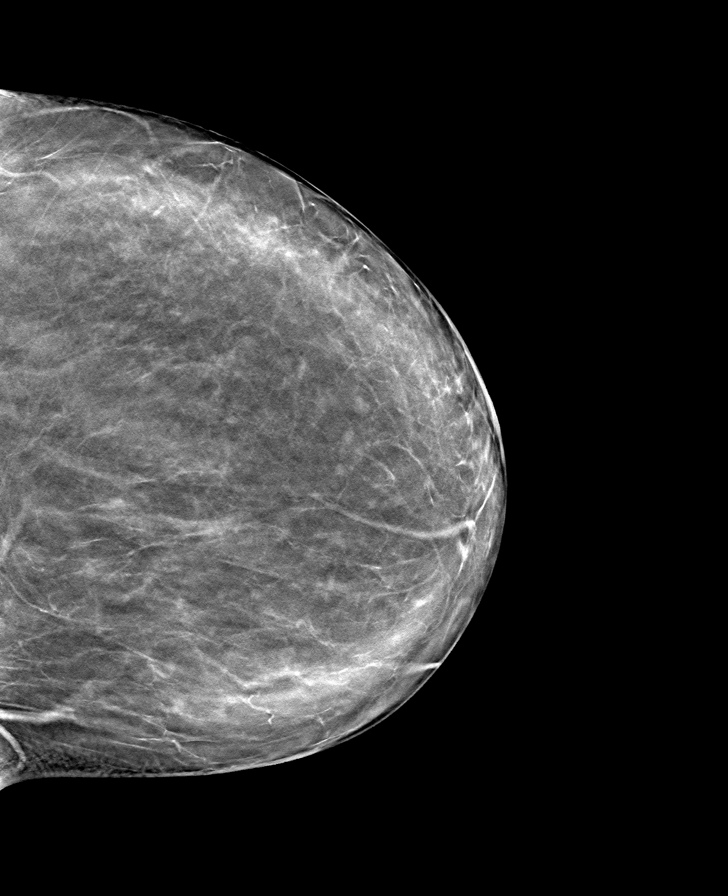

[8 of 24 positions shown; findings below may reference images not displayed]

FINDINGS: There are no findings suspicious for malignancy.
IMPRESSION: No mammographic evidence of malignancy. A result letter of this
screening mammogram will be mailed directly to the patient.

RECOMMENDATION:
Screening mammogram in one year. (Code:0E-3-N98)

BI-RADS CATEGORY  1: Negative.

## 2022-04-05 NOTE — Progress Notes (Unsigned)
No chief complaint on file.   HPI: Patient  [name]  71 y.o. comes in today for Preventive Health Care visit   BP: Lotensin hctz  HB :  pepcid not that helpful  Dr Collene Mares .     Health Maintenance  Topic Date Due   Diabetic kidney evaluation - Urine ACR  03/24/2015   COVID-19 Vaccine (6 - Pfizer risk series) 04/24/2021   HEMOGLOBIN A1C  09/01/2021   Diabetic kidney evaluation - GFR measurement  03/04/2022   INFLUENZA VACCINE  09/06/2022 (Originally 01/06/2022)   TETANUS/TDAP  02/19/2023 (Originally 06/08/2017)   Medicare Annual Wellness (AWV)  02/19/2023   MAMMOGRAM  01/21/2024   COLONOSCOPY (Pts 45-48yr Insurance coverage will need to be confirmed)  11/17/2026   Pneumonia Vaccine 70 Years old  Completed   DEXA SCAN  Completed   Hepatitis C Screening  Completed   Zoster Vaccines- Shingrix  Completed   HPV VACCINES  Aged Out   Health Maintenance Review LIFESTYLE:  Exercise:  walks at  park 3 miles  Tobacco/ETS:n Alcohol: ocass Sugar beverages:n Sleep: 6 hours  Drug use: no HH of 2  1 pet dog  Retired     ROS:  G REST of 12 system review negative except as per HPI   Past Medical History:  Diagnosis Date   Diverticulitis    GERD (gastroesophageal reflux disease)    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    Social History   Socioeconomic History   Marital status: Married    Spouse name: Not on file    Number of children: Not on file   Years of education: Not on file   Highest education level: Not on file  Occupational History   Occupation: Retired  Tobacco Use   Smoking status: Never   Smokeless tobacco: Never  Vaping Use   Vaping Use: Never used  Substance and Sexual Activity   Alcohol use: Yes    Alcohol/week: 0.0 standard drinks of alcohol    Comment: seldom   Drug use: No   Sexual activity: Not Currently    Partners: Male    Birth control/protection: Other-see comments    Comment: husband had prostate cancer and BTL  Other Topics Concern   Not on file  Social History Narrative   X-ray tech    Married household with 2 lives with husband and the dog negative ETS   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   Social alcohol no tobacco some caffeine    fa   G2P2   Social Determinants of Health   Financial Resource Strain: Low Risk  (02/18/2022)   Overall  Financial Resource Strain (CARDIA)    Difficulty of Paying Living Expenses: Not hard at all  Food Insecurity: No Food Insecurity (02/18/2022)   Hunger Vital Sign    Worried About Running Out of Food in the Last Year: Never true    Ran Out of Food in the Last Year: Never true  Transportation Needs: No Transportation Needs (02/18/2022)   PRAPARE - Hydrologist (Medical): No    Lack of Transportation (Non-Medical): No  Physical Activity: Sufficiently Active (02/18/2022)   Exercise Vital Sign    Days of Exercise per Week: 3 days    Minutes of Exercise per Session: 70 min  Stress: No Stress Concern Present (02/18/2022)   [place]    Feeling of Stress : Not at all  Social Connections: [place] (02/18/2022)   Social Connection and Isolation Panel [NHANES]    Frequency of Communication with Friends and Family: More than three times a week    Frequency of Social Gatherings with Friends and  Family: More than three times a week    Attends Religious Services: More than 4 times per year    Active Member of [employer] or Organizations: Yes    Attends Music therapist: More than 4 times per year    Marital Status: Married    Outpatient Medications Prior to Visit  Medication Sig Dispense Refill   benazepril-hydrochlorthiazide (LOTENSIN HCT) 20-12.5 MG tablet TAKE 1 TABLET BY MOUTH EVERY DAY 90 tablet 0   famotidine (PEPCID) 40 MG tablet Take 40 mg by mouth 2 (two) times daily.     ibuprofen (ADVIL) 200 MG tablet Take 200 mg by mouth as needed.     PFIZER-BIONT COVID-19 VAC-TRIS SUSP injection      polyethylene glycol (MIRALAX / GLYCOLAX) 17 g packet Take 17 g by mouth daily. Every other day     No facility-administered medications prior to visit.     EXAM:  LMP 06/08/2004   There is no height or weight on file to calculate BMI. Wt Readings from Last 3 Encounters:  02/18/22 203 lb (92.1 kg)  03/04/21 207 lb 12.8 oz (94.3 kg)  03/01/20 216 lb 12.8 oz (98.3 kg)    Physical Exam: Vital signs reviewed EVO:JJKK is a well-developed well-nourished alert cooperative    who appearsr stated age in no acute distress.  HEENT: normocephalic atraumatic , Eyes: PERRL EOM's full, conjunctiva clear, Nares: paten,t no deformity discharge or tenderness., Ears: no deformity EAC's clear TMs with normal landmarks. Mouth: clear OP, no lesions, edema.  Moist mucous membranes. Dentition in adequate repair. NECK: supple without masses, thyromegaly or bruits. CHEST/PULM:  Clear to auscultation and percussion breath sounds equal no wheeze , rales or rhonchi. No chest wall deformities or tenderness. Breast: normal by inspection . No dimpling, discharge, masses, tenderness or discharge . CV: PMI is nondisplaced, S1 S2 no gallops, murmurs, rubs. Peripheral pulses are full without delay.No JVD .  ABDOMEN: Bowel sounds normal nontender  No guard or rebound, no hepato splenomegal no CVA  tenderness.  Extremtities:  No clubbing cyanosis or edema, no acute joint swelling or redness no focal atrophy NEURO:  Oriented x3, cranial nerves 3-12 appear to be intact, no obvious focal weakness,gait within normal limits no abnormal reflexes or asymmetrical SKIN: No acute rashes normal turgor, color, no bruising or petechiae. PSYCH: Oriented, good eye contact, no obvious depression anxiety, cognition and judgment appear normal. LN: no cervical axillary  inguinal adenopathy  Lab Results  Component Value Date   WBC 9.6 02/13/2021   HGB 12.8 02/13/2021   HCT 37 02/13/2021   PLT 313 03/01/2020   GLUCOSE 112 (H) 03/04/2021   CHOL 198 03/04/2021   TRIG 96.0 03/04/2021   HDL 63.40 03/04/2021   LDLCALC 115 (H) 03/04/2021   ALT 19 03/01/2020   AST 17 03/01/2020   NA 139 03/04/2021   K 3.5 03/04/2021   CL 101 03/04/2021   CREATININE 0.89 03/04/2021   BUN 21 03/04/2021   CO2 30 03/04/2021   TSH 1.75 03/04/2021   HGBA1C 5.9 03/04/2021   MICROALBUR 0.2 03/23/2014    BP Readings from Last 3 Encounters:  03/04/21 130/68  03/01/20 124/72  02/28/19 136/82    Lab plan reviewed with patient   ASSESSMENT AND PLAN:  Discussed the following assessment and plan:    ICD-10-CM   1. Visit for preventive health examination  Z00.00     2. Essential hypertension  I10     3. Hyperglycemia  R73.9     4. Medication management  Z79.899      No follow-ups on file.  Patient Care Team: Elliette Seabolt, Standley Brooking, MD as PCP - General (Internal Medicine) Juanita Craver, MD as Attending Physician (Gastroenterology) Jola Schmidt, MD as Consulting Physician (Ophthalmology) Megan Salon, MD as Consulting Physician (Gynecology) There are no Patient Instructions on file for this visit.  Standley Brooking. Lonisha Bobby M.D.

## 2022-04-06 ENCOUNTER — Encounter: Payer: Self-pay | Admitting: Internal Medicine

## 2022-04-06 ENCOUNTER — Ambulatory Visit (INDEPENDENT_AMBULATORY_CARE_PROVIDER_SITE_OTHER): Payer: Medicare HMO | Admitting: Internal Medicine

## 2022-04-06 VITALS — BP 118/66 | HR 67 | Temp 98.2°F | Ht 62.0 in | Wt 205.6 lb

## 2022-04-06 DIAGNOSIS — I10 Essential (primary) hypertension: Secondary | ICD-10-CM | POA: Diagnosis not present

## 2022-04-06 DIAGNOSIS — R739 Hyperglycemia, unspecified: Secondary | ICD-10-CM

## 2022-04-06 DIAGNOSIS — E785 Hyperlipidemia, unspecified: Secondary | ICD-10-CM | POA: Diagnosis not present

## 2022-04-06 DIAGNOSIS — Z23 Encounter for immunization: Secondary | ICD-10-CM

## 2022-04-06 DIAGNOSIS — Z0000 Encounter for general adult medical examination without abnormal findings: Secondary | ICD-10-CM

## 2022-04-06 DIAGNOSIS — K219 Gastro-esophageal reflux disease without esophagitis: Secondary | ICD-10-CM | POA: Diagnosis not present

## 2022-04-06 DIAGNOSIS — Z79899 Other long term (current) drug therapy: Secondary | ICD-10-CM | POA: Diagnosis not present

## 2022-04-06 LAB — CBC WITH DIFFERENTIAL/PLATELET
Basophils Absolute: 0.1 10*3/uL (ref 0.0–0.1)
Basophils Relative: 0.5 % (ref 0.0–3.0)
Eosinophils Absolute: 0.5 10*3/uL (ref 0.0–0.7)
Eosinophils Relative: 4.8 % (ref 0.0–5.0)
HCT: 36.8 % (ref 36.0–46.0)
Hemoglobin: 12.5 g/dL (ref 12.0–15.0)
Lymphocytes Relative: 29.8 % (ref 12.0–46.0)
Lymphs Abs: 3 10*3/uL (ref 0.7–4.0)
MCHC: 34.1 g/dL (ref 30.0–36.0)
MCV: 87 fl (ref 78.0–100.0)
Monocytes Absolute: 0.6 10*3/uL (ref 0.1–1.0)
Monocytes Relative: 5.5 % (ref 3.0–12.0)
Neutro Abs: 5.9 10*3/uL (ref 1.4–7.7)
Neutrophils Relative %: 59.4 % (ref 43.0–77.0)
Platelets: 340 10*3/uL (ref 150.0–400.0)
RBC: 4.23 Mil/uL (ref 3.87–5.11)
RDW: 12.8 % (ref 11.5–15.5)
WBC: 10 10*3/uL (ref 4.0–10.5)

## 2022-04-06 LAB — LIPID PANEL
Cholesterol: 203 mg/dL — ABNORMAL HIGH (ref 0–200)
HDL: 65.9 mg/dL (ref >39.00)
LDL Cholesterol: 121 mg/dL — ABNORMAL HIGH (ref 0–99)
NonHDL: 137.35
Total CHOL/HDL Ratio: 3
Triglycerides: 80 mg/dL (ref 0.0–149.0)
VLDL: 16 mg/dL (ref 0.0–40.0)

## 2022-04-06 LAB — HEPATIC FUNCTION PANEL
ALT: 14 U/L (ref 0–35)
AST: 14 U/L (ref 0–37)
Albumin: 4.1 g/dL (ref 3.5–5.2)
Alkaline Phosphatase: 79 U/L (ref 39–117)
Bilirubin, Direct: 0.1 mg/dL (ref 0.0–0.3)
Total Bilirubin: 0.6 mg/dL (ref 0.2–1.2)
Total Protein: 7 g/dL (ref 6.0–8.3)

## 2022-04-06 LAB — BASIC METABOLIC PANEL
BUN: 27 mg/dL — ABNORMAL HIGH (ref 6–23)
CO2: 28 mEq/L (ref 19–32)
Calcium: 9.2 mg/dL (ref 8.4–10.5)
Chloride: 100 mEq/L (ref 96–112)
Creatinine, Ser: 0.91 mg/dL (ref 0.40–1.20)
GFR: 63.96 mL/min (ref >60.00)
Glucose, Bld: 92 mg/dL (ref 70–99)
Potassium: 3.6 mEq/L (ref 3.5–5.1)
Sodium: 137 mEq/L (ref 135–145)

## 2022-04-06 LAB — TSH: TSH: 1.22 u[IU]/mL (ref 0.35–5.50)

## 2022-04-06 LAB — HEMOGLOBIN A1C: Hgb A1c MFr Bld: 6.1 % (ref 4.6–6.5)

## 2022-04-06 NOTE — Patient Instructions (Signed)
Good to see you today  Lab  and if all ok then yearly visit. Attend to sleep hygeine.   Bmi lowering

## 2022-04-06 NOTE — Progress Notes (Signed)
Cholesterol slightly up from last year Rest of results are good A1c is stable at 6.1 in the prediabetic range Thyroid and blood count are normal. You can consider being more intensive lifestyle to get sure lipid levels to a better range Consider statin or cholesterol medicine to lower otherwise   not a compelling case except age is a risk factor You can consider $99 self-pay CT coronary calcium scan and if high range may want to add statin medication.to reduce risk of  vascular event  The 10-year ASCVD risk score (Arnett DK, et al., 2019) is: 19.3%   Values used to calculate the score:     Age: 71 years     Sex: Female     Is Non-Hispanic African American: No     Diabetic: Yes     Tobacco smoker: No     Systolic Blood Pressure: 599 mmHg     Is BP treated: Yes     HDL Cholesterol: 65.9 mg/dL     Total Cholesterol: 203 mg/dL

## 2022-04-10 ENCOUNTER — Encounter: Payer: Self-pay | Admitting: Internal Medicine

## 2022-04-10 DIAGNOSIS — E7800 Pure hypercholesterolemia, unspecified: Secondary | ICD-10-CM

## 2022-04-10 DIAGNOSIS — Z9189 Other specified personal risk factors, not elsewhere classified: Secondary | ICD-10-CM

## 2022-04-15 NOTE — Telephone Encounter (Signed)
Please order a self pay ct coronary calcium scan  dx  cardiovascular risk  and elevated cholesterol for patient

## 2022-05-14 ENCOUNTER — Other Ambulatory Visit: Payer: Self-pay | Admitting: Internal Medicine

## 2022-06-02 ENCOUNTER — Ambulatory Visit (HOSPITAL_BASED_OUTPATIENT_CLINIC_OR_DEPARTMENT_OTHER)
Admission: RE | Admit: 2022-06-02 | Discharge: 2022-06-02 | Disposition: A | Discharge status: Home / Self Care | Payer: Medicare HMO | Source: Ambulatory Visit | Attending: Internal Medicine | Admitting: Internal Medicine

## 2022-06-02 DIAGNOSIS — Z9189 Other specified personal risk factors, not elsewhere classified: Secondary | ICD-10-CM | POA: Insufficient documentation

## 2022-06-02 DIAGNOSIS — E7800 Pure hypercholesterolemia, unspecified: Secondary | ICD-10-CM | POA: Insufficient documentation

## 2022-10-31 ENCOUNTER — Other Ambulatory Visit: Payer: Self-pay | Admitting: Internal Medicine

## 2022-11-13 ENCOUNTER — Telehealth: Payer: Self-pay | Admitting: Gastroenterology

## 2022-11-13 NOTE — Telephone Encounter (Signed)
Hi Dr. Lonna Cobb,     This patient is requesting to have her care transferred specifically over to you. She previous has history with Dr. Loreta Ave and last had a colonoscopy in 2018. States she has been dealing with GERD symptoms for awhile now and felt as through Dr. Loreta Ave has not been much help. States she was not happy with the care that was provided during office visits and procedures. She would also like to have her care transferred over to you because her husband is a patient of yours and they both have been very happy with the care you have provided. Her records are EPIC and EPIC media for you to review and advise on scheduling.    Thank you.

## 2022-11-26 ENCOUNTER — Encounter: Payer: Self-pay | Admitting: Gastroenterology

## 2022-11-27 NOTE — Telephone Encounter (Signed)
Hi Dr. Barron Alvine,  Additional records from Dr. Loreta Ave were just received and scanned into Media for you to further review.   Thanks

## 2022-11-27 NOTE — Telephone Encounter (Signed)
Additional records reviewed and notable for the following:  Past medical and surgical history: HTN, diverticulosis, HSV, hemorrhoids, GERD, obesity, cholecystectomy 2006, C-section x 2  - 11/10/2004: Colonoscopy: Sigmoid diverticulosis, internal hemorrhoids, otherwise normal - 06/05/2011: EGD with Bravo placement: Normal esophagus, mild gastritis, normal duodenum - 06/05/2011: Bravo results not included in EMR - 11/16/2016: Colonoscopy: Sigmoid diverticulosis, otherwise normal.  Repeat 10 years - 02/26/2022: Last appointment with Dr. Loreta Ave.  History of GERD along with bloating and belching.  Treated with famotidine 40 mg 1-2 times daily.  Was previously treated with omeprazole 40 mg daily with good control, but changed to Pepcid due to concerns about long-term side effects of PPIs.  Had previously been treated with Dexilant in 2014, but changed back to omeprazole in 2019 due to no insurance coverage of Dexilant.  Reflux was not well-controlled with omeprazole 20 mg daily.  Uses MiraLAX on demand for occasional constipation.  Otherwise 1 BM daily.  Recommended 1 year follow-up  Reviewed most recent labs from Dr. Loreta Ave from 2022: Normal TSH, CMP (BG 157), CBC

## 2023-01-15 ENCOUNTER — Other Ambulatory Visit: Payer: Self-pay | Admitting: Internal Medicine

## 2023-01-15 DIAGNOSIS — Z1231 Encounter for screening mammogram for malignant neoplasm of breast: Secondary | ICD-10-CM

## 2023-01-21 ENCOUNTER — Encounter (INDEPENDENT_AMBULATORY_CARE_PROVIDER_SITE_OTHER): Payer: Self-pay

## 2023-02-02 DIAGNOSIS — H2513 Age-related nuclear cataract, bilateral: Secondary | ICD-10-CM | POA: Diagnosis not present

## 2023-02-02 DIAGNOSIS — H5203 Hypermetropia, bilateral: Secondary | ICD-10-CM | POA: Diagnosis not present

## 2023-02-10 ENCOUNTER — Ambulatory Visit
Admission: RE | Admit: 2023-02-10 | Discharge: 2023-02-10 | Disposition: A | Discharge status: Home / Self Care | Payer: Medicare HMO | Source: Ambulatory Visit | Attending: Internal Medicine | Admitting: Internal Medicine

## 2023-02-10 DIAGNOSIS — Z1231 Encounter for screening mammogram for malignant neoplasm of breast: Secondary | ICD-10-CM | POA: Diagnosis not present

## 2023-02-23 ENCOUNTER — Ambulatory Visit (INDEPENDENT_AMBULATORY_CARE_PROVIDER_SITE_OTHER): Payer: Medicare HMO | Admitting: Family Medicine

## 2023-02-23 ENCOUNTER — Encounter: Payer: Self-pay | Admitting: Family Medicine

## 2023-02-23 VITALS — BP 120/74 | HR 69 | Ht 63.0 in | Wt 203.0 lb

## 2023-02-23 DIAGNOSIS — Z0000 Encounter for general adult medical examination without abnormal findings: Secondary | ICD-10-CM

## 2023-02-23 NOTE — Patient Instructions (Signed)
I really enjoyed getting to talk with you today! I am available on Tuesdays and Thursdays for virtual visits if you have any questions or concerns, or if I can be of any further assistance.   CHECKLIST FROM ANNUAL WELLNESS VISIT:  -Follow up (please call to schedule if not scheduled after visit):   -yearly for annual wellness visit with primary care office  Here is a list of your preventive care/health maintenance measures and the plan for each if any are due:  PLAN For any measures below that may be due:  -can get the vaccines at the pharmacy - please keep copy of receipt so can update chart at next visit -can get labs at your in office visit -let us know when you want to do your bone density test  Health Maintenance  Topic Date Due   Diabetic kidney evaluation - Urine ACR  03/24/2015   DTaP/Tdap/Td (2 - Td or Tdap) 06/08/2017   HEMOGLOBIN A1C  10/06/2022   COVID-19 Vaccine (7 - 2023-24 season) 03/11/2023 (Originally 02/07/2023)   INFLUENZA VACCINE  03/15/2024 (Originally 01/07/2023)   Diabetic kidney evaluation - eGFR measurement  04/07/2023   Medicare Annual Wellness (AWV)  02/23/2024   MAMMOGRAM  02/09/2025   Colonoscopy  11/17/2026   Pneumonia Vaccine 49+ Years old  Completed   DEXA SCAN  Completed   Hepatitis C Screening  Completed   Zoster Vaccines- Shingrix  Completed   HPV VACCINES  Aged Out    -See a dentist at least yearly  -Get your eyes checked and then per your eye specialist's recommendations  -Other issues addressed today:   -I have included below further information regarding a healthy whole foods based diet, physical activity guidelines for adults, stress management and opportunities for social connections. I hope you find this information useful.    -----------------------------------------------------------------------------------------------------------------------------------------------------------------------------------------------------------------------------------------------------------  NUTRITION: -eat real food: lots of colorful vegetables (half the plate) and fruits -5-7 servings of vegetables and fruits per day (fresh or steamed is best), exp. 2 servings of vegetables with lunch and dinner and 2 servings of fruit per day. Berries and greens such as kale and collards are great choices.  -consume on a regular basis: whole grains (make sure first ingredient on label contains the word "whole"), fresh fruits, fish, nuts, seeds, healthy oils (such as olive oil, avocado oil, grape seed oil) -may eat small amounts of dairy and lean meat on occasion, but avoid processed meats such as ham, bacon, lunch meat, etc. -drink water -try to avoid fast food and pre-packaged foods, processed meat -most experts advise limiting sodium to < 2300mg  per day, should limit further is any chronic conditions such as high blood pressure, heart disease, diabetes, etc. The American Heart Association advised that < 1500mg  is is ideal -try to avoid foods that contain any ingredients with names you do not recognize  -try to avoid sugar/sweets (except for the natural sugar that occurs in fresh fruit) -try to avoid sweet drinks -try to avoid white rice, white bread, pasta (unless whole grain), white or yellow potatoes  EXERCISE GUIDELINES FOR ADULTS: -if you wish to increase your physical activity, do so gradually and with the approval of your doctor -STOP and seek medical care immediately if you have any chest pain, chest discomfort or trouble breathing when starting or increasing exercise  -move and stretch your body, legs, feet and arms when sitting for long periods -Physical activity guidelines for optimal health in adults: -least 150 minutes per week of  aerobic exercise (  can talk, but not sing) once approved by your doctor, 20-30 minutes of sustained activity or two 10 minute episodes of sustained activity every day.  -resistance training at least 2 days per week if approved by your doctor -balance exercises 3+ days per week:   Stand somewhere where you have something sturdy to hold onto if you lose balance.    1) lift up on toes, start with 5x per day and work up to 20x   2) stand and lift on leg straight out to the side so that foot is a few inches of the floor, start with 5x each side and work up to 20x each side   3) stand on one foot, start with 5 seconds each side and work up to 20 seconds on each side  If you need ideas or help with getting more active:  -Silver sneakers https://tools.silversneakers.com  -Walk with a Doc: http://www.duncan-williams.com/  -try to include resistance (weight lifting/strength building) and balance exercises twice per week: or the following link for ideas: http://castillo-powell.com/  BuyDucts.dk  STRESS MANAGEMENT: -can try meditating, or just sitting quietly with deep breathing while intentionally relaxing all parts of your body for 5 minutes daily -if you need further help with stress, anxiety or depression please follow up with your primary doctor or contact the wonderful folks at [facility]: [phone]  SOCIAL CONNECTIONS: -options in [place] if you wish to engage in more social and exercise related activities:  -Silver sneakers https://tools.silversneakers.com  -Walk with a Doc: http://www.duncan-williams.com/  -Check out the [facility] Active Adults 50+ section on the [place] [employer] (hiking clubs, book clubs, cards and games, chess, exercise classes, aquatic classes and much more) - see the website for  details: https://www.[place].gov/departments/parks-recreation/active-adults50  -YouTube has lots of exercise videos for different ages and abilities as well  -[facility] (a variety of indoor and outdoor inperson activities for adults). 4198139050. [address]  -Virtual Online Classes (a variety of topics): see seniorplanet.org or call 3320050427  -consider volunteering at a school, hospice center, church, senior center or elsewhere

## 2023-02-23 NOTE — Progress Notes (Signed)
PATIENT CHECK-IN and HEALTH RISK ASSESSMENT QUESTIONNAIRE:  -completed by phone/video for upcoming Medicare Preventive Visit  Pre-Visit Check-in: 1)Vitals (height, wt, BP, etc) - record in vitals section for visit on day of visit Request home vitals (wt, BP, etc.) and enter into vitals, THEN update Vital Signs SmartPhrase below at the top of the HPI. See below.  2)Review and Update Medications, Allergies PMH, Surgeries, Social history in Epic 3)Hospitalizations in the last year with date/reason? No   4)Review and Update Care Team (patient's specialists) in Epic 5) Complete PHQ9 in Epic  6) Complete Fall Screening in Epic 7)Review all Health Maintenance Due and order under PCP if not done.  8)Medicare Wellness Questionnaire: Answer theses question about your habits: Do you drink alcohol? Yes  If yes, how many drinks do you have a day?2 Month  Have you ever smoked?NO  Quit date if applicable? No   How many packs a day do/did you smoke? NO  Do you use smokeless tobacco?NO  Do you use an illicit drugs? NO  Do you exercises? Yes IF so, what type and how many days/minutes per week? 3  days a week, hour - cardio, muscle building, doing a strength class - has balance too in class Are you sexually active? No Number of partners?0 Typical breakfast: coffee, bagel Typical lunch: sandwich  Typical dinner: soup  Typical snacks: cookies  Beverages: Iced tea, Water   Answer theses question about you: Can you perform most household chores?Yes  Do you find it hard to follow a conversation in a noisy room? NO  Do you often ask people to speak up or repeat themselves? No  Do you feel that you have a problem with memory?NO  Do you balance your checkbook and or bank acounts?Yes  Do you feel safe at home?Yes  Last dentist visit?6 months ago  Do you need assistance with any of the following: Please note if so   Driving? No   Feeding yourself? No   Getting from bed to chair? No   Getting to the  toilet? No   Bathing or showering? No   Dressing yourself? No   Managing money? No   Climbing a flight of stairs? No   Preparing meals? No   Do you have Advanced Directives in place (Living Will, Healthcare Power or Attorney)? No, but has the info to do - she plans to do.    Last eye Exam and location?Last month, Dr. Cathey Endow, [facility]   Do you currently use prescribed or non-prescribed narcotic or opioid pain medications? No   Do you have a history or close family history of breast, ovarian, tubal or peritoneal cancer or a family member with BRCA (breast cancer susceptibility 1 and 2) gene mutations? NO   Request home vitals (wt, BP, etc.) and enter into vitals, THEN update Vital Signs SmartPhrase below at the top of the HPI. See below.   Nurse/Assistant Credentials/time stamp: Stann Ore CMA    ----------------------------------------------------------------------------------------------------------------------------------------------------------------------------------------------------------------------  Vital Signs: Vital signs are patient reported.   MEDICARE ANNUAL PREVENTIVE VISIT WITH PROVIDER: (Welcome to Medicare, initial annual wellness or annual wellness exam)  Virtual Visit via Video Note  I connected with AMARIANNA MATHEY on 02/23/23 by a video enabled telemedicine application and verified that I am speaking with the correct person using two identifiers.  Location patient: home Location provider:work or home office Persons participating in the virtual visit: patient, provider  Concerns and/or follow up today:   See HM section in Epic  for other details of completed HM.    ROS: negative for report of fevers, unintentional weight loss, vision changes, vision loss, hearing loss or change, chest pain, sob, hemoptysis, melena, hematochezia, hematuria, falls, bleeding or bruising, thoughts of suicide or self harm, memory loss  Patient-completed extensive  health risk assessment - reviewed and discussed with the patient: See Health Risk Assessment completed with patient prior to the visit either above or in recent phone note. This was reviewed in detailed with the patient today and appropriate recommendations, orders and referrals were placed as needed per Summary below and patient instructions.   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    hx reccurent takes valtrex for flare   Hx of varicella    Hypertension     Past Surgical History:  Procedure Laterality Date   CESAREAN SECTION     2   CHOLECYSTECTOMY  2005   TUBAL LIGATION     BTL    Social History   Socioeconomic History   Marital status: Married    Spouse name: Not on file   Number of children: Not on file   Years of education: Not on file   Highest education level: Not on file  Occupational History   Occupation: Retired  Tobacco Use   Smoking status: Never    Passive exposure: Never   Smokeless tobacco: Never  Vaping Use   Vaping status: Never Used  Substance and Sexual Activity   Alcohol use: Yes    Alcohol/week: 0.0 standard drinks of alcohol    Comment: seldom   Drug use: No   Sexual activity: Not Currently    Partners: Male    Birth control/protection: Other-see comments    Comment: husband had prostate cancer and BTL  Other Topics Concern   Not on file  Social History Narrative   X-ray tech    Married household with 2 lives with husband and the dog negative ETS    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   Social alcohol no tobacco some caffeine    fa   G2P2   Social Determinants of Health   Financial Resource Strain: Low Risk  (02/18/2022)   Overall Financial Resource Strain (CARDIA)    Difficulty of Paying Living Expenses: Not hard at all  Food Insecurity: No Food Insecurity (02/18/2022)   Hunger Vital Sign    Worried About Running Out of Food in the Last Year: Never true    Ran Out of Food in the Last Year: Never true  Transportation Needs: No Transportation Needs (02/18/2022)   PRAPARE - Administrator, Civil Service (Medical): No    Lack of Transportation (Non-Medical): No  Physical Activity: Sufficiently Active (02/18/2022)   Exercise Vital Sign    Days of Exercise per Week: 3 days    Minutes of Exercise per Session: 70 min  Stress: No Stress Concern Present (02/18/2022)   [employer] of Occupational Health - Occupational Stress Questionnaire    Feeling of  Stress : Not at all  Social Connections: Socially Integrated (02/18/2022)   Social Connection and Isolation Panel [NHANES]    Frequency of Communication with Friends and Family: More than three times a week    Frequency of Social Gatherings with Friends and Family: More than three times a week    Attends Religious Services: More than 4 times per year    Active Member of [employer] or Organizations: Yes    Attends Engineer, structural: More than 4 times per year    Marital Status: Married  Catering manager Violence: Not At Risk (02/18/2022)   Humiliation, Afraid, Rape, and Kick questionnaire    Fear of Current or Ex-Partner: No    Emotionally Abused: No    Physically Abused: No    Sexually Abused: No    Family History  Problem Relation Age of Onset   Hyperlipidemia Mother    Hypertension Mother    COPD Mother        Deceased   Hypertension Father    Cancer - Other Father        Liver deceased   Hypertension Sister     Stroke Sister    Arthritis Sister    Obstructive Sleep Apnea Sister        And secondary atrial fibrillation.    Current Outpatient Medications on File Prior to Visit  Medication Sig Dispense Refill   benazepril-hydrochlorthiazide (LOTENSIN HCT) 20-12.5 MG tablet TAKE 1 TABLET BY MOUTH EVERY DAY 90 tablet 1   famotidine (PEPCID) 40 MG tablet Take 40 mg by mouth 2 (two) times daily.     ibuprofen (ADVIL) 200 MG tablet Take 200 mg by mouth as needed.     PFIZER-BIONT COVID-19 VAC-TRIS SUSP injection      polyethylene glycol (MIRALAX / GLYCOLAX) 17 g packet Take 17 g by mouth daily. Every other day     No current facility-administered medications on file prior to visit.    Allergies  Allergen Reactions   Neosporin Eczema Essentials [Colloidal Oatmeal]     rash       Physical Exam Vitals requested from patient and listed below if patient had equipment and was able to obtain at home for this virtual visit: Vitals:   02/23/23 1117  BP: 120/74  Pulse: 69   Estimated body mass index is 35.96 kg/m as calculated from the following:   Height as of this encounter: 5\' 3"  (1.6 m).   Weight as of this encounter: 203 lb (92.1 kg).  EKG (optional): deferred due to virtual visit  GENERAL: alert, oriented, no acute distress detected, full vision exam deferred due to pandemic and/or virtual encounter  HEENT: atraumatic, conjunttiva clear, no obvious abnormalities on inspection of external nose and ears  NECK: normal movements of the head and neck  LUNGS: on inspection no signs of respiratory distress, breathing rate appears normal, no obvious gross SOB, gasping or wheezing  CV: no obvious cyanosis  MS: moves all visible extremities without noticeable abnormality  PSYCH/NEURO: pleasant and cooperative, no obvious depression or anxiety, speech and thought processing grossly intact, Cognitive function grossly intact  Flowsheet Row Office Visit from 02/23/2023 in [facility]Care at [facility]  PHQ-9 Total Score 2           02/23/2023   11:21 AM 04/06/2022    1:36 PM 02/18/2022    9:24 AM 01/29/2021    9:37 AM 01/24/2020    9:35 AM  Depression screen PHQ 2/9  Decreased Interest  0 0 0 0 0  Down, Depressed, Hopeless 0 0 0 0 0  PHQ - 2 Score 0 0 0 0 0  Altered sleeping 2 1     Tired, decreased energy 0 0     Change in appetite 0 0     Feeling bad or failure about yourself  0 0     Trouble concentrating 0 0     Moving slowly or fidgety/restless 0 0     Suicidal thoughts 0 0     PHQ-9 Score 2 1     Difficult doing work/chores Not difficult at all Not difficult at all          01/29/2021    9:40 AM 02/15/2022    8:10 AM 02/18/2022    9:26 AM 04/06/2022    1:36 PM 02/23/2023   11:20 AM  Fall Risk  Falls in the past year? 0 0 0 0 0  Was there an injury with Fall? 0  0 0 0  Fall Risk Category Calculator 0  0 0 0  Fall Risk Category (Retired) Low  Low Low   (RETIRED) Patient Fall Risk Level   Low fall risk Low fall risk   Patient at Risk for Falls Due to Impaired vision  No Fall Risks No Fall Risks No Fall Risks  Fall risk Follow up Falls prevention discussed  Falls prevention discussed Falls evaluation completed Falls evaluation completed     SUMMARY AND PLAN:  Encounter for Medicare annual wellness exam    Discussed applicable health maintenance/preventive health measures and advised and referred or ordered per patient preferences: -she is scheduled to see PCP and plans to do labs then -she plans to get the vaccines due at the pharmacy and agrees to bring copy of receipt to PCP -bone density was normal on last check in 2-21, offered to order, she plans to hold off for now. Advised recheck every 3-5 years, weight bearing exercise, vit d in the winter months, adequate calcium intake from diet  Health Maintenance  Topic Date Due   Diabetic kidney evaluation - Urine ACR  03/24/2015   DTaP/Tdap/Td (2 - Td or Tdap) 06/08/2017   HEMOGLOBIN A1C   10/06/2022   COVID-19 Vaccine (7 - 2023-24 season) 03/11/2023 (Originally 02/07/2023)   INFLUENZA VACCINE  03/15/2024 (Originally 01/07/2023)   Diabetic kidney evaluation - eGFR measurement  04/07/2023   Medicare Annual Wellness (AWV)  02/23/2024   MAMMOGRAM  02/09/2025   Colonoscopy  11/17/2026   Pneumonia Vaccine 53+ Years old  Completed   DEXA SCAN  Completed   Hepatitis C Screening  Completed   Zoster Vaccines- Shingrix  Completed   HPV VACCINES  Aged [employer] and counseling on the following was provided based on the above review of health and a plan/checklist for the patient, along with additional information discussed, was provided for the patient in the patient instructions :  -Advised on importance of completing advanced directives, discussed options for completing and provided information in patient instructions as well -Advised and counseled on a healthy lifestyle - including the importance of a healthy diet, regular physical activity, social connections and stress management. -Reviewed patient's current diet. Advised and counseled on a whole foods based healthy diet. A summary of a healthy diet was provided in the Patient Instructions.  -reviewed patient's current physical activity level and discussed exercise guidelines for adults. Congratulated on regular exercise and meeting guidelines and encouraged to continue.  -Advise yearly  dental visits at minimum and regular eye exams -  Follow up: see patient instructions     Patient Instructions  I really enjoyed getting to talk with you today! I am available on Tuesdays and Thursdays for virtual visits if you have any questions or concerns, or if I can be of any further assistance.   CHECKLIST FROM ANNUAL WELLNESS VISIT:  -Follow up (please call to schedule if not scheduled after visit):   -yearly for annual wellness visit with primary care office  Here is a list of your preventive care/health maintenance measures  and the plan for each if any are due:  PLAN For any measures below that may be due:  -can get the vaccines at the pharmacy - please keep copy of receipt so can update chart at next visit -can get labs at your in office visit -let us know when you want to do your bone density test  Health Maintenance  Topic Date Due   Diabetic kidney evaluation - Urine ACR  03/24/2015   DTaP/Tdap/Td (2 - Td or Tdap) 06/08/2017   HEMOGLOBIN A1C  10/06/2022   COVID-19 Vaccine (7 - 2023-24 season) 03/11/2023 (Originally 02/07/2023)   INFLUENZA VACCINE  03/15/2024 (Originally 01/07/2023)   Diabetic kidney evaluation - eGFR measurement  04/07/2023   Medicare Annual Wellness (AWV)  02/23/2024   MAMMOGRAM  02/09/2025   Colonoscopy  11/17/2026   Pneumonia Vaccine 72+ Years old  Completed   DEXA SCAN  Completed   Hepatitis C Screening  Completed   Zoster Vaccines- Shingrix  Completed   HPV VACCINES  Aged Out    -See a dentist at least yearly  -Get your eyes checked and then per your eye specialist's recommendations  -Other issues addressed today:   -I have included below further information regarding a healthy whole foods based diet, physical activity guidelines for adults, stress management and opportunities for social connections. I hope you find this information useful.   -----------------------------------------------------------------------------------------------------------------------------------------------------------------------------------------------------------------------------------------------------------  NUTRITION: -eat real food: lots of colorful vegetables (half the plate) and fruits -5-7 servings of vegetables and fruits per day (fresh or steamed is best), exp. 2 servings of vegetables with lunch and dinner and 2 servings of fruit per day. Berries and greens such as kale and collards are great choices.  -consume on a regular basis: whole grains (make sure first ingredient on label  contains the word "whole"), fresh fruits, fish, nuts, seeds, healthy oils (such as olive oil, avocado oil, grape seed oil) -may eat small amounts of dairy and lean meat on occasion, but avoid processed meats such as ham, bacon, lunch meat, etc. -drink water -try to avoid fast food and pre-packaged foods, processed meat -most experts advise limiting sodium to < 2300mg  per day, should limit further is any chronic conditions such as high blood pressure, heart disease, diabetes, etc. The American Heart Association advised that < 1500mg  is is ideal -try to avoid foods that contain any ingredients with names you do not recognize  -try to avoid sugar/sweets (except for the natural sugar that occurs in fresh fruit) -try to avoid sweet drinks -try to avoid white rice, white bread, pasta (unless whole grain), white or yellow potatoes  EXERCISE GUIDELINES FOR ADULTS: -if you wish to increase your physical activity, do so gradually and with the approval of your doctor -STOP and seek medical care immediately if you have any chest pain, chest discomfort or trouble breathing when starting or increasing exercise  -move and stretch your body, legs, feet and  arms when sitting for long periods -Physical activity guidelines for optimal health in adults: -least 150 minutes per week of aerobic exercise (can talk, but not sing) once approved by your doctor, 20-30 minutes of sustained activity or two 10 minute episodes of sustained activity every day.  -resistance training at least 2 days per week if approved by your doctor -balance exercises 3+ days per week:   Stand somewhere where you have something sturdy to hold onto if you lose balance.    1) lift up on toes, start with 5x per day and work up to 20x   2) stand and lift on leg straight out to the side so that foot is a few inches of the floor, start with 5x each side and work up to 20x each side   3) stand on one foot, start with 5 seconds each side and work up to  20 seconds on each side  If you need ideas or help with getting more active:  -Silver sneakers https://tools.silversneakers.com  -Walk with a Doc: http://www.duncan-williams.com/  -try to include resistance (weight lifting/strength building) and balance exercises twice per week: or the following link for ideas: http://castillo-powell.com/  BuyDucts.dk  STRESS MANAGEMENT: -can try meditating, or just sitting quietly with deep breathing while intentionally relaxing all parts of your body for 5 minutes daily -if you need further help with stress, anxiety or depression please follow up with your primary doctor or contact the wonderful folks at [facility]: [phone]  SOCIAL CONNECTIONS: -options in [place] if you wish to engage in more social and exercise related activities:  -Silver sneakers https://tools.silversneakers.com  -Walk with a Doc: http://www.duncan-williams.com/  -Check out the [facility] Active Adults 50+ section on the [place] of [employer] (hiking clubs, book clubs, cards and games, chess, exercise classes, aquatic classes and much more) - see the website for details: https://www.[place].gov/departments/parks-recreation/active-adults50  -YouTube has lots of exercise videos for different ages and abilities as well  -[facility] (a variety of indoor and outdoor inperson activities for adults). ([phone])584-0494. [address].  -Virtual Online Classes (a variety of topics): see seniorplanet.org or call [phone]  -consider volunteering at a school, hospice center, church, senior center or elsewhere           Terressa Koyanagi, DO

## 2023-03-02 ENCOUNTER — Ambulatory Visit: Payer: Medicare HMO | Admitting: Gastroenterology

## 2023-03-02 ENCOUNTER — Encounter: Payer: Self-pay | Admitting: Gastroenterology

## 2023-03-02 ENCOUNTER — Other Ambulatory Visit: Payer: Self-pay | Admitting: *Deleted

## 2023-03-02 ENCOUNTER — Other Ambulatory Visit (INDEPENDENT_AMBULATORY_CARE_PROVIDER_SITE_OTHER): Payer: Medicare HMO

## 2023-03-02 VITALS — BP 134/80 | HR 87 | Ht 63.0 in | Wt 206.2 lb

## 2023-03-02 DIAGNOSIS — R142 Eructation: Secondary | ICD-10-CM

## 2023-03-02 DIAGNOSIS — K219 Gastro-esophageal reflux disease without esophagitis: Secondary | ICD-10-CM | POA: Diagnosis not present

## 2023-03-02 DIAGNOSIS — E538 Deficiency of other specified B group vitamins: Secondary | ICD-10-CM

## 2023-03-02 DIAGNOSIS — Z7189 Other specified counseling: Secondary | ICD-10-CM | POA: Diagnosis not present

## 2023-03-02 DIAGNOSIS — R12 Heartburn: Secondary | ICD-10-CM | POA: Diagnosis not present

## 2023-03-02 DIAGNOSIS — E559 Vitamin D deficiency, unspecified: Secondary | ICD-10-CM

## 2023-03-02 LAB — BASIC METABOLIC PANEL
BUN: 23 mg/dL (ref 6–23)
CO2: 29 mEq/L (ref 19–32)
Calcium: 9.3 mg/dL (ref 8.4–10.5)
Chloride: 102 mEq/L (ref 96–112)
Creatinine, Ser: 1 mg/dL (ref 0.40–1.20)
GFR: 56.76 mL/min — ABNORMAL LOW (ref >60.00)
Glucose, Bld: 110 mg/dL — ABNORMAL HIGH (ref 70–99)
Potassium: 3.9 mEq/L (ref 3.5–5.1)
Sodium: 140 mEq/L (ref 135–145)

## 2023-03-02 LAB — FOLATE: Folate: 5.9 ng/mL — ABNORMAL LOW (ref >5.9)

## 2023-03-02 LAB — MAGNESIUM: Magnesium: 1.8 mg/dL (ref 1.5–2.5)

## 2023-03-02 LAB — CBC
HCT: 36.8 % (ref 36.0–46.0)
Hemoglobin: 12.4 g/dL (ref 12.0–15.0)
MCHC: 33.6 g/dL (ref 30.0–36.0)
MCV: 87.7 fl (ref 78.0–100.0)
Platelets: 338 10*3/uL (ref 150.0–400.0)
RBC: 4.19 Mil/uL (ref 3.87–5.11)
RDW: 12.9 % (ref 11.5–15.5)
WBC: 7.4 10*3/uL (ref 4.0–10.5)

## 2023-03-02 LAB — VITAMIN B12: Vitamin B-12: 154 pg/mL — ABNORMAL LOW (ref 211–911)

## 2023-03-02 LAB — IBC + FERRITIN
Ferritin: 92.5 ng/mL (ref 10.0–291.0)
Iron: 103 ug/dL (ref 42–145)
Saturation Ratios: 31 % (ref 20.0–50.0)
TIBC: 331.8 ug/dL (ref 250.0–450.0)
Transferrin: 237 mg/dL (ref 212.0–360.0)

## 2023-03-02 LAB — VITAMIN D 25 HYDROXY (VIT D DEFICIENCY, FRACTURES): VITD: 16.28 ng/mL — ABNORMAL LOW (ref 30.00–100.00)

## 2023-03-02 MED ORDER — OMEPRAZOLE 40 MG PO CPDR: 40.0000 mg | DELAYED_RELEASE_CAPSULE | Freq: Every day | ORAL | 3 refills | Status: DC

## 2023-03-02 MED ORDER — VITAMIN D (ERGOCALCIFEROL) 1.25 MG (50000 UNIT) PO CAPS
50000.0000 [IU] | ORAL_CAPSULE | ORAL | 0 refills | Status: DC
Start: 2023-03-02 — End: 2024-03-02

## 2023-03-02 NOTE — Patient Instructions (Addendum)
_______________________________________________________  If your blood pressure at your visit was 140/90 or greater, please contact your primary care physician to follow up on this.  _______________________________________________________  If you are age 71 or older, your body mass index should be between 23-30. Your Body mass index is 36.54 kg/m. If this is out of the aforementioned range listed, please consider follow up with your Primary Care Provider.  If you are age 21 or younger, your body mass index should be between 19-25. Your Body mass index is 36.54 kg/m. If this is out of the aformentioned range listed, please consider follow up with your Primary Care Provider.   ________________________________________________________  The [place] [facility] providers would like to encourage you to use [facility] to communicate with providers for non-urgent requests or questions.  Due to long hold times on the telephone, sending your provider a message by [facility] may be a faster and more efficient way to get a response.  Please allow 48 business hours for a response.  Please remember that this is for non-urgent requests.  _______________________________________________________  Your provider has requested that you go to the basement level for lab work before leaving today. Press "B" on the elevator. The lab is located at the first door on the left as you exit the elevator.  We have sent the following medications to your pharmacy for you to pick up at your convenience: Omeprazole daily  You have been scheduled for an endoscopy. Please follow written instructions given to you at your visit today.  If you use inhalers (even only as needed), please bring them with you on the day of your procedure.  If you take any of the following medications, they will need to be adjusted prior to your procedure:   DO NOT TAKE 7 DAYS PRIOR TO TEST- Trulicity (dulaglutide) Ozempic, Wegovy (semaglutide) Mounjaro  (tirzepatide) Bydureon Bcise (exanatide extended release)  DO NOT TAKE 1 DAY PRIOR TO YOUR TEST Rybelsus (semaglutide) Adlyxin (lixisenatide) Victoza (liraglutide) Byetta (exanatide) ___________________________________________________________________________   It was a pleasure to see you today!  Vito Cirigliano, D.O.

## 2023-03-02 NOTE — Addendum Note (Signed)
Addended by: Richardson Chiquito on: 03/02/2023 04:47 PM   Modules accepted: Orders

## 2023-03-02 NOTE — Progress Notes (Signed)
Chief Complaint: GERD   Referring Provider:     [name], MD   HPI:     [name] is a 72 y.o. female with a history of HTN, diverticulosis, HSV, hemorrhoids, GERD, obesity, cholecystectomy 2006, C-section x 2, referred to the [facility] for evaluation of GERD.   She reports a longstanding history of GERD for many years.  Index symptoms of heartburn, sore throat, belching, with episodic nocturnal regurgitation.  No dysphagia.  Has been treated with omeprazole in the past with good clinical response, but changed to Pepcid due to concerns about long-term side effects with PPIs.  Dexilant was also efficacious, but had to change back to omeprazole due to insurance.  More recently has been treating with Pepcid 40 mg daily.  Occasionally takes a second dose for breakthrough, but overall does not feel the Pepcid is efficacious as she has regular breakthrough symptoms.  No documented history of medication intolerance, allergy.  No history of osteoporosis, hip fracture, CKD.  No known history of vitamin deficiency.  Previous GI history: - 11/10/2004: Colonoscopy: Sigmoid diverticulosis, internal hemorrhoids, otherwise normal - 06/05/2011: EGD with Bravo placement: Normal esophagus, mild gastritis, normal duodenum - 06/05/2011: Bravo results not included in EMR - 11/16/2016: Colonoscopy: Sigmoid diverticulosis, otherwise normal.  Repeat 10 years - 02/26/2022: Last appointment with Dr. Loreta Ave.  History of GERD along with bloating and belching.  Treated with famotidine 40 mg 1-2 times daily.  Was previously treated with omeprazole 40 mg daily with good control, but changed to Pepcid due to concerns about long-term side effects of PPIs.  Had previously been treated with Dexilant in 2014, but changed back to omeprazole in 2019 due to no insurance coverage of Dexilant.  Reflux was not well-controlled with omeprazole 20 mg daily.  Uses MiraLAX on demand for occasional  constipation.  Otherwise 1 BM daily.  Recommended 1 year follow-up  Reviewed most recent labs from 03/2022: Normal CBC, BMP (BUN 27), liver enzymes, TSH.  No recent abdominal imaging for review.   Father with liver cancer (heavy EtOH use). Otherwise, no known family history of colon cancer, pancreatic disease, esophageal/stomach cancer, IBD.   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   Social History   Tobacco Use   Smoking status: Never    Passive exposure: Never   Smokeless tobacco: Never  Vaping Use   Vaping status: Never Used  Substance Use Topics   Alcohol use: Yes    Alcohol/week: 0.0 standard drinks of alcohol  Comment: seldom   Drug use: No   Current Outpatient Medications  Medication Sig Dispense Refill   benazepril-hydrochlorthiazide (LOTENSIN HCT) 20-12.5 MG tablet TAKE 1 TABLET BY MOUTH EVERY DAY 90 tablet 1   famotidine (PEPCID) 40 MG tablet Take 40 mg by mouth 2 (two) times daily.     ibuprofen (ADVIL) 200 MG tablet Take 200 mg by mouth as needed.     PFIZER-BIONT COVID-19 VAC-TRIS SUSP injection      polyethylene glycol (MIRALAX / GLYCOLAX) 17 g packet Take 17 g by mouth daily. Every other day     No current facility-administered medications for this visit.   Allergies  Allergen Reactions   Neosporin Eczema  Essentials [Colloidal Oatmeal]     rash     Review of Systems: All systems reviewed and negative except where noted in HPI.     Physical Exam:    Wt Readings from Last 3 Encounters:  02/23/23 203 lb (92.1 kg)  04/06/22 205 lb 9.6 oz (93.3 kg)  02/18/22 203 lb (92.1 kg)    LMP 06/08/2004  Constitutional:  Pleasant, in no acute distress. Psychiatric: Normal mood and affect. Behavior is normal. Cardiovascular: Normal rate, regular rhythm. No edema Pulmonary/chest: Effort normal and breath sounds normal. No wheezing, rales or rhonchi. Abdominal: Soft, nondistended, nontender. Bowel sounds active throughout. There are no masses palpable. No hepatomegaly. Neurological: Alert and oriented to person place and time. Skin: Skin is warm and dry. No rashes noted.   ASSESSMENT AND PLAN;   1) GERD 2) Heartburn 3) Belching 72 year old female with longstanding history of GERD.  Has had good response to PPI in the past, but ultimately changed to H2RA d/t concerns about medication ADR profile of PPIs in the long-term.  Pepcid has not been nearly as efficacious as Prilosec.  We had an in-depth conversation today regarding the pathophysiology of reflux along with treatment options.  We also discussed the risk/benefit profile of PPIs, and ultimately through shared decision making feel that the benefits outweigh the risks of resuming PPI therapy.  Plan for the following:  - Start omeprazole 40 mg daily - Depending on clinical response, may require short course of high-dose PPI therapy - Can stop Pepcid when starting omeprazole - EGD to evaluate for erosive esophagitis, LES laxity, hiatal hernia - Given long history of acid suppression therapy, check BMP, magnesium, CBC, B12, folate, iron panel, vitamin D.  If all normal/unremarkable, plan for continued annual lab check while taking PPI  4) Medication counseling -As above, I have reviewed the indications, risks, and benefits of PPI therapy with  the patient today. I have discussed studies that raise ? of increased osteoporosis, and kidney failure and explained that these studies show very weak associations of unclear significance and not clear cause and effect. We did discuss the potential for vitamin malabsorption, to include magnesium (very rare), calcium (easily modifiable with Calcium Citrate supplement), vitamin B12 (again, correctable with oral B12 supplement), and iron (although rarely clinically significant outside patients who require iron supplementation previously), and can monitor each of these periodically with routine labs.   The indications, risks, and benefits of EGD were explained to the patient in detail. Risks include but are not limited to bleeding, perforation, adverse reaction to medications, and cardiopulmonary compromise. Sequelae include but are not limited to the possibility of surgery, hospitalization, and mortality. The patient verbalized understanding and wished to proceed. All questions answered, referred to scheduler. Further recommendations pending results of the exam.  Shellia Cleverly, DO, FACG  03/02/2023, 9:00 AM   Panosh, Neta Mends, MD

## 2023-03-26 ENCOUNTER — Encounter: Payer: Self-pay | Admitting: Gastroenterology

## 2023-04-09 ENCOUNTER — Ambulatory Visit (AMBULATORY_SURGERY_CENTER): Payer: Medicare HMO | Admitting: Gastroenterology

## 2023-04-09 ENCOUNTER — Other Ambulatory Visit: Payer: Self-pay | Admitting: Medical Genetics

## 2023-04-09 ENCOUNTER — Encounter: Payer: Self-pay | Admitting: Gastroenterology

## 2023-04-09 VITALS — BP 104/57 | HR 59 | Temp 98.1°F | Resp 9 | Ht 63.0 in | Wt 206.0 lb

## 2023-04-09 DIAGNOSIS — Z006 Encounter for examination for normal comparison and control in clinical research program: Secondary | ICD-10-CM

## 2023-04-09 DIAGNOSIS — K2990 Gastroduodenitis, unspecified, without bleeding: Secondary | ICD-10-CM

## 2023-04-09 DIAGNOSIS — R12 Heartburn: Secondary | ICD-10-CM

## 2023-04-09 DIAGNOSIS — K2970 Gastritis, unspecified, without bleeding: Secondary | ICD-10-CM | POA: Diagnosis not present

## 2023-04-09 DIAGNOSIS — R142 Eructation: Secondary | ICD-10-CM | POA: Diagnosis not present

## 2023-04-09 DIAGNOSIS — K219 Gastro-esophageal reflux disease without esophagitis: Secondary | ICD-10-CM | POA: Diagnosis not present

## 2023-04-09 DIAGNOSIS — I10 Essential (primary) hypertension: Secondary | ICD-10-CM | POA: Diagnosis not present

## 2023-04-09 DIAGNOSIS — E669 Obesity, unspecified: Secondary | ICD-10-CM | POA: Diagnosis not present

## 2023-04-09 MED ORDER — SODIUM CHLORIDE 0.9 % IV SOLN: 500.0000 mL | INTRAVENOUS | Status: DC

## 2023-04-09 NOTE — Progress Notes (Signed)
GASTROENTEROLOGY PROCEDURE H&P NOTE   Primary Care Physician: [name], MD    Reason for Procedure:   GERD  Plan:    EGD  Patient is appropriate for endoscopic procedure(s) in the ambulatory ([facility]) setting.  The nature of the procedure, as well as the risks, benefits, and alternatives were carefully and thoroughly reviewed with the patient. Ample time for discussion and questions allowed. The patient understood, was satisfied, and agreed to proceed.     HPI: [name] is a 72 y.o. female who presents for EGD for evaluation of GERD, heartburn, belching.   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 Authorizing Provider  benazepril-hydrochlorthiazide (LOTENSIN HCT) 20-12.5 MG tablet TAKE 1 TABLET BY MOUTH EVERY DAY 11/01/22  Yes Worthy Rancher B, FNP  omeprazole (PRILOSEC) 40 MG capsule Take 1 capsule (40 mg total) by mouth daily. 03/02/23  Yes Brick Ketcher V, DO  polyethylene glycol (MIRALAX / GLYCOLAX) 17 g packet Take 17 g by mouth daily. Every other day   Yes [provider]  Vitamin D, Ergocalciferol, (DRISDOL) 1.25 MG (50000 UNIT) CAPS capsule Take 1 capsule (50,000 Units total) by mouth every 7 (seven) days. 03/02/23  Yes Landrum Carbonell V, DO  famotidine (PEPCID) 40 MG tablet Take 40 mg by mouth 2 (two) times daily. Patient not taking: Reported on 04/09/2023 01/08/20   [provider]  ibuprofen (ADVIL) 200 MG tablet  Take 200 mg by mouth as needed.    [provider]    Current Outpatient Medications  Medication Sig Dispense Refill   benazepril-hydrochlorthiazide (LOTENSIN HCT) 20-12.5 MG tablet TAKE 1 TABLET BY MOUTH EVERY DAY 90 tablet 1   omeprazole (PRILOSEC) 40 MG capsule Take 1 capsule (40 mg total) by mouth daily. 90 capsule 3   polyethylene glycol (MIRALAX / GLYCOLAX) 17 g packet Take 17 g by mouth daily. Every other day     Vitamin D, Ergocalciferol, (DRISDOL) 1.25 MG (50000 UNIT) CAPS capsule Take 1 capsule (50,000 Units total) by mouth every 7 (seven) days. 8 capsule 0   famotidine (PEPCID) 40 MG tablet Take 40 mg by mouth 2 (two) times daily. (Patient not taking: Reported on 04/09/2023)     ibuprofen (ADVIL) 200 MG tablet Take 200 mg by mouth as needed.     Current Facility-Administered Medications  Medication Dose Route Frequency Provider Last Rate Last Admin   0.9 %  sodium chloride infusion  500 mL Intravenous Continuous Mayrani Khamis V, DO        Allergies as of 04/09/2023 - Review Complete 04/09/2023  Allergen Reaction Noted   Neosporin eczema essentials [colloidal oatmeal]  01/24/2020    Family History  Problem Relation Age of Onset   Hyperlipidemia Mother    Hypertension Mother    COPD Mother  Deceased   Hypertension Father    Cancer - Other Father        Liver deceased   Liver cancer Father    Hypertension Sister    Stroke Sister    Arthritis Sister    Obstructive Sleep Apnea Sister        And secondary atrial fibrillation.   Colon cancer Neg Hx    Esophageal cancer Neg Hx     Social History   Socioeconomic History   Marital status: Married    Spouse name: Not on file   Number of children: Not on file   Years of education: Not on file   Highest education level: Not on file  Occupational History   Occupation: Retired  Tobacco Use   Smoking status: Never    Passive exposure: Never   Smokeless tobacco: Never  Vaping Use   Vaping status:  Never Used  Substance and Sexual Activity   Alcohol use: Yes    Alcohol/week: 0.0 standard drinks of alcohol    Comment: seldom   Drug use: No   Sexual activity: Not Currently    Partners: Male    Birth control/protection: Other-see comments    Comment: husband had prostate cancer and BTL  Other Topics Concern   Not on file  Social History Narrative   X-ray tech    Married household with 2 lives with husband and the dog negative ETS   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   Social alcohol no tobacco some caffeine    fa   G2P2   Social Determinants of Health   Financial Resource Strain: Low Risk  (02/18/2022)   Overall Financial Resource Strain (CARDIA)    Difficulty of Paying Living Expenses: Not hard at all  Food Insecurity: No Food Insecurity (02/18/2022)   Hunger Vital Sign    Worried About Running Out of Food in the Last Year: Never true    Ran Out of Food in the Last Year: Never true  Transportation Needs: No Transportation Needs (02/18/2022)   PRAPARE - Administrator, Civil Service (Medical): No    Lack of Transportation (Non-Medical): No  Physical Activity: Sufficiently Active (02/18/2022)   Exercise Vital Sign    Days of Exercise per Week: 3 days    Minutes of Exercise per Session: 70 min  Stress: No Stress Concern Present (02/18/2022)   [employer] of Occupational Health - Occupational Stress Questionnaire    Feeling of Stress : Not at all  Social Connections: Socially Integrated (02/18/2022)   Social Connection and Isolation Panel [NHANES]    Frequency of Communication with Friends and Family: More than three times a week    Frequency of Social Gatherings with Friends and Family: More than three times a week    Attends Religious Services: More than 4 times per year    Active Member of [employer] or Organizations: Yes    Attends Engineer, structural: More than 4 times per year    Marital Status: Married   Catering manager Violence: Not At Risk (02/18/2022)   Humiliation, Afraid, Rape, and Kick questionnaire    Fear of Current or Ex-Partner: No    Emotionally Abused: No    Physically Abused: No    Sexually Abused: No    Physical Exam: Vital signs in last 24 hours: @BP  (!) 141/63   Pulse 60   Temp 98.1 F (36.7 C)   Ht 5\' 3"  (  1.6 m)   Wt 206 lb (93.4 kg)   LMP 06/08/2004   SpO2 99%   BMI 36.49 kg/m  GEN: NAD EYE: Sclerae anicteric ENT: MMM CV: Non-tachycardic Pulm: CTA b/l GI: Soft, NT/ND NEURO:  Alert & Oriented x 3   Doristine Locks, DO [facility]   04/09/2023 9:22 AM

## 2023-04-09 NOTE — Op Note (Signed)
[facility] Patient Name: [name] Procedure Date: 04/09/2023 9:18 AM MRN: [id number] Endoscopist: Doristine Locks , MD, [id number] Age: 72 Referring MD:  Date of Birth: 1950/06/12 Gender: Female Account #: [id number] Procedure:                Upper GI endoscopy Indications:              Heartburn, Esophageal reflux, Eructation Medicines:                Monitored Anesthesia Care Procedure:                Pre-Anesthesia Assessment:                           - Prior to the procedure, a History and Physical                            was performed, and patient medications and                            allergies were reviewed. The patient's tolerance of                            previous anesthesia was also reviewed. The risks                            and benefits of the procedure and the sedation                            options and risks were discussed with the patient.                            All questions were answered, and informed consent                            was obtained. Prior Anticoagulants: The patient has                            taken no anticoagulant or antiplatelet agents. ASA                            Grade Assessment: II - A patient with mild systemic                            disease. After reviewing the risks and benefits,                            the patient was deemed in satisfactory condition to                            undergo the procedure.                           After obtaining informed consent, the endoscope was  passed under direct vision. Throughout the                            procedure, the patient's blood pressure, pulse, and                            oxygen saturations were monitored continuously. The                            Olympus Scope [id number] was introduced through the                            mouth, and advanced to the second part of duodenum.                            The upper GI  endoscopy was accomplished without                            difficulty. The patient tolerated the procedure                            well. Scope In: Scope Out: Findings:                 The upper third of the esophagus, middle third of                            the esophagus and lower third of the esophagus were                            normal.                           The Z-line was regular and was found 34 cm from the                            incisors.                           The gastroesophageal flap valve was visualized                            endoscopically and classified as Hill Grade III                            (minimal fold, loose to endoscope).                           Patchy mild inflammation characterized by                            congestion (edema) and erythema was found at the                            incisura and in the gastric antrum. Biopsies were  taken with a cold forceps for histology. Estimated                            blood loss was minimal.                           The gastric fundus and gastric body were normal.                            Additional mucosal biopsies were taken with a cold                            forceps for histology and Helicobacter pylori                            testing. Estimated blood loss was minimal.                           The examined duodenum was normal. Complications:            No immediate complications. Estimated Blood Loss:     Estimated blood loss was minimal. Impression:               - Normal upper third of esophagus, middle third of                            esophagus and lower third of esophagus.                           - Z-line regular, 34 cm from the incisors.                           - Gastroesophageal flap valve classified as Hill                            Grade III (minimal fold, loose to endoscope).                           - Mild, non-ulcer gastritis in  the distal antrum                            and incisura. Biopsied.                           - Normal gastric fundus and gastric body. Biopsied.                           - Normal examined duodenum. Recommendation:           - Patient has a contact number available for                            emergencies. The signs and symptoms of potential                            delayed  complications were discussed with the                            patient. Return to normal activities tomorrow.                            Written discharge instructions were provided to the                            patient.                           - Resume previous diet.                           - Continue present medications.                           - Await pathology results.                           - Follow-up with me in the GI clinic at routine                            appointment to be scheduled. Doristine Locks, MD 04/09/2023 9:46:48 AM

## 2023-04-09 NOTE — Patient Instructions (Signed)
Resume all of your previous medications today as ordered.  Be sure to take your omeprazole 1/2 hour before your eat.  It will not work on an a full stomach.  Read all of your handouts regarding your discharge.  YOU HAD AN ENDOSCOPIC PROCEDURE TODAY AT THE [facility]:   Refer to the procedure report that was given to you for any specific questions about what was found during the examination.  If the procedure report does not answer your questions, please call your gastroenterologist to clarify.  If you requested that your care partner not be given the details of your procedure findings, then the procedure report has been included in a sealed envelope for you to review at your convenience later.  YOU SHOULD EXPECT: Some feelings of bloating in the abdomen. Passage of more gas than usual.  Please Note:  You might notice some irritation and congestion in your nose or some drainage.  This is from the oxygen used during your procedure.  There is no need for concern and it should clear up in a day or so.  SYMPTOMS TO REPORT IMMEDIATELY:  Following upper endoscopy (EGD)  Vomiting of blood or coffee ground material  New chest pain or pain under the shoulder blades  Painful or persistently difficult swallowing  New shortness of breath  Fever of 100F or higher  Black, tarry-looking stools  For urgent or emergent issues, a gastroenterologist can be reached at any hour by calling [phone]. Do not use MyChart messaging for urgent concerns.    DIET:  We do recommend a small meal at first, but then you may proceed to your regular diet.  Drink plenty of fluids but you should avoid alcoholic beverages for 24 hours.  ACTIVITY:  You should plan to take it easy for the rest of today and you should NOT DRIVE or use heavy machinery until tomorrow (because of the sedation medicines used during the test).    FOLLOW UP: Our staff will call the number listed on your records the next business day  following your procedure.  We will call around 7:15- 8:00 am to check on you and address any questions or concerns that you may have regarding the information given to you following your procedure. If we do not reach you, we will leave a message.     If any biopsies were taken you will be contacted by phone or by letter within the next 1-3 weeks.  Please call us at [phone] if you have not heard about the biopsies in 3 weeks.    SIGNATURES/CONFIDENTIALITY: You and/or your care partner have signed paperwork which will be entered into your electronic medical record.  These signatures attest to the fact that that the information above on your After Visit Summary has been reviewed and is understood.  Full responsibility of the confidentiality of this discharge information lies with you and/or your care-partner.

## 2023-04-09 NOTE — Progress Notes (Signed)
Vss nad trans to pacu 

## 2023-04-09 NOTE — Progress Notes (Signed)
Pt's states no medical or surgical changes since previsit or office visit. 

## 2023-04-09 NOTE — Progress Notes (Signed)
Called to room to assist during endoscopic procedure.  Patient ID and intended procedure confirmed with present staff. Received instructions for my participation in the procedure from the performing physician.  

## 2023-04-12 ENCOUNTER — Encounter: Payer: Medicare HMO | Admitting: Internal Medicine

## 2023-04-12 ENCOUNTER — Telehealth: Payer: Self-pay | Admitting: *Deleted

## 2023-04-12 NOTE — Telephone Encounter (Signed)
  Follow up Call-     04/09/2023    9:13 AM  Call back number  Post procedure Call Back phone  # [phone]  Permission to leave phone message Yes     Patient questions:  Do you have a fever, pain , or abdominal swelling? No. Pain Score  0 *  Have you tolerated food without any problems? Yes.    Have you been able to return to your normal activities? Yes.    Do you have any questions about your discharge instructions: Diet   No. Medications  No. Follow up visit  No.  Do you have questions or concerns about your Care? No.  Actions: * If pain score is 4 or above: No action needed, pain <4.

## 2023-04-12 NOTE — Progress Notes (Unsigned)
No chief complaint on file.   HPI: Patient  [name]  72 y.o. comes in today for Preventive Health Care visit   HT GI per GI  gerd esophagitis   Health Maintenance  Topic Date Due   Diabetic kidney evaluation - Urine ACR  03/24/2015   DTaP/Tdap/Td (2 - Td or Tdap) 06/08/2017   HEMOGLOBIN A1C  10/06/2022   INFLUENZA VACCINE  03/15/2024 (Originally 01/07/2023)   COVID-19 Vaccine (8 - 2023-24 season) 04/22/2023   Medicare Annual Wellness (AWV)  02/23/2024   Diabetic kidney evaluation - eGFR measurement  03/01/2024   MAMMOGRAM  02/09/2025   Colonoscopy  11/17/2026   Pneumonia Vaccine 80+ Years old  Completed   DEXA SCAN  Completed   Hepatitis C Screening  Completed   Zoster Vaccines- Shingrix  Completed   HPV VACCINES  Aged Out   Health Maintenance Review LIFESTYLE:  Exercise:   Tobacco/ETS: Alcohol:  Sugar beverages: Sleep: Drug use: no HH of  Work:    ROS:  GEN/ HEENT: No fever, significant weight changes sweats headaches vision problems hearing changes, CV/ PULM; No chest pain shortness of breath cough, syncope,edema  change in exercise tolerance. GI /GU: No adominal pain, vomiting, change in bowel habits. No blood in the stool. No significant GU symptoms. SKIN/HEME: ,no acute skin rashes suspicious lesions or bleeding. No lymphadenopathy, nodules, masses.  NEURO/ PSYCH:  No neurologic signs such as weakness numbness. No depression anxiety. IMM/ Allergy: No unusual infections.  Allergy .   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   Hx of varicella    Hypertension    Obesity     Past Surgical History:  Procedure Laterality Date   CESAREAN SECTION     2   CHOLECYSTECTOMY  06/09/2003   COLONOSCOPY     Dr Loreta Ave around 2018    ESOPHAGOGASTRODUODENOSCOPY     Dr Loreta Ave around 2009   TUBAL LIGATION     BTL    Family History  Problem Relation Age of Onset   Hyperlipidemia Mother    Hypertension Mother    COPD Mother        Deceased   Hypertension Father    Cancer - Other Father        Liver deceased   Liver cancer Father    Hypertension Sister    Stroke Sister    Arthritis Sister    Obstructive Sleep Apnea Sister        And secondary atrial fibrillation.   Colon cancer Neg Hx    Esophageal cancer Neg Hx     Social History   Socioeconomic History   Marital status: Married    Spouse name: Not on file   Number of children: Not on file   Years of education: Not on file   Highest education level: Not on file  Occupational History   Occupation: Retired  Tobacco Use   Smoking status: Never    Passive exposure: Never   Smokeless tobacco: Never  Vaping Use   Vaping status: Never Used  Substance and Sexual Activity   Alcohol use: Yes    Alcohol/week: 0.0 standard drinks of alcohol    Comment: seldom   Drug use: No  Sexual activity: Not Currently    Partners: Male    Birth control/protection: Other-see comments    Comment: husband had prostate cancer and BTL  Other Topics Concern   Not on file  Social History Narrative   X-ray tech    Married household with 2 lives with husband and the dog negative ETS   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   Social alcohol no tobacco some caffeine    fa   G2P2   Social Determinants of Health   Financial Resource Strain: Low Risk  (02/18/2022)   Overall Financial Resource Strain (CARDIA)    Difficulty of Paying Living Expenses: Not hard at all  Food Insecurity: No Food Insecurity (02/18/2022)   Hunger Vital Sign    Worried About Running Out of Food in the Last Year: Never true    Ran Out of Food in the Last Year: Never true  Transportation Needs: No Transportation Needs (02/18/2022)   PRAPARE - Doctor, general practice (Medical): No    Lack of Transportation (Non-Medical): No  Physical Activity: Sufficiently Active (02/18/2022)   Exercise Vital Sign    Days of Exercise per Week: 3 days    Minutes of Exercise per Session: 70 min  Stress: No Stress Concern Present (02/18/2022)   [employer] of Occupational Health - Occupational Stress Questionnaire    Feeling of Stress : Not at all  Social Connections: Socially Integrated (02/18/2022)   Social Connection and Isolation Panel [NHANES]    Frequency of Communication with Friends and Family: More than three times a week    Frequency of Social Gatherings with Friends and Family: More than three times a week    Attends Religious Services: More than 4 times per year    Active Member of [employer] or Organizations: Yes    Attends Engineer, structural: More than 4 times per year    Marital Status: Married    Outpatient Medications Prior to Visit  Medication Sig Dispense Refill   benazepril-hydrochlorthiazide (LOTENSIN HCT) 20-12.5 MG tablet TAKE 1 TABLET BY MOUTH EVERY DAY 90 tablet 1   famotidine (PEPCID) 40 MG tablet Take 40 mg by mouth 2 (two) times daily. (Patient not taking: Reported on 04/09/2023)     ibuprofen (ADVIL) 200 MG tablet Take 200 mg by mouth as needed.     omeprazole (PRILOSEC) 40 MG capsule Take 1 capsule (40 mg total) by mouth daily. 90 capsule 3   polyethylene glycol (MIRALAX / GLYCOLAX) 17 g packet Take 17 g by mouth daily. Every other day     Vitamin D, Ergocalciferol, (DRISDOL) 1.25 MG (50000 UNIT) CAPS capsule Take 1 capsule (50,000 Units total) by mouth every 7 (seven) days. 8 capsule 0   No facility-administered medications prior to visit.     EXAM:  LMP 06/08/2004   There is no height or weight on file to calculate BMI. Wt Readings from Last 3 Encounters:  04/09/23 206 lb (93.4 kg)  03/02/23 206 lb 4 oz (93.6 kg)  02/23/23 203 lb (92.1 kg)    Physical Exam: Vital signs reviewed MWN:UUVO is a  well-developed well-nourished alert cooperative    who appearsr stated age in no acute distress.  HEENT: normocephalic atraumatic , Eyes: PERRL EOM's full, conjunctiva clear, Nares: paten,t no deformity discharge or tenderness., Ears: no deformity EAC's clear TMs with normal landmarks. Mouth: clear OP, no lesions, edema.  Moist mucous membranes. Dentition in  adequate repair. NECK: supple without masses, thyromegaly or bruits. CHEST/PULM:  Clear to auscultation and percussion breath sounds equal no wheeze , rales or rhonchi. No chest wall deformities or tenderness. Breast: normal by inspection . No dimpling, discharge, masses, tenderness or discharge . CV: PMI is nondisplaced, S1 S2 no gallops, murmurs, rubs. Peripheral pulses are full without delay.No JVD .  ABDOMEN: Bowel sounds normal nontender  No guard or rebound, no hepato splenomegal no CVA tenderness.  No hernia. Extremtities:  No clubbing cyanosis or edema, no acute joint swelling or redness no focal atrophy NEURO:  Oriented x3, cranial nerves 3-12 appear to be intact, no obvious focal weakness,gait within normal limits no abnormal reflexes or asymmetrical SKIN: No acute rashes normal turgor, color, no bruising or petechiae. PSYCH: Oriented, good eye contact, no obvious depression anxiety, cognition and judgment appear normal. LN: no cervical axillary inguinal adenopathy  Lab Results  Component Value Date   WBC 7.4 03/02/2023   HGB 12.4 03/02/2023   HCT 36.8 03/02/2023   PLT 338.0 03/02/2023   GLUCOSE 110 (H) 03/02/2023   CHOL 203 (H) 04/06/2022   TRIG 80.0 04/06/2022   HDL 65.90 04/06/2022   LDLCALC 121 (H) 04/06/2022   ALT 14 04/06/2022   AST 14 04/06/2022   NA 140 03/02/2023   K 3.9 03/02/2023   CL 102 03/02/2023   CREATININE 1.00 03/02/2023   BUN 23 03/02/2023   CO2 29 03/02/2023   TSH 1.22 04/06/2022   HGBA1C 6.1 04/06/2022   MICROALBUR 0.2 03/23/2014    BP Readings from Last 3 Encounters:  04/09/23 (!) 104/57   03/02/23 134/80  02/23/23 120/74    Lab update reviewed with patient   ASSESSMENT AND PLAN:  Discussed the following assessment and plan:    ICD-10-CM   1. Visit for preventive health examination  Z00.00     2. Medication management  Z79.899     3. Essential hypertension  I10      No follow-ups on file.  Patient Care Team: Nabor Thomann, Neta Mends, MD as PCP - General (Internal Medicine) Charna Elizabeth, MD as Attending Physician (Gastroenterology) Sinda Du, MD as Consulting Physician (Ophthalmology) Jerene Bears, MD as Consulting Physician (Gynecology) There are no Patient Instructions on file for this visit.  Neta Mends. Alyas Creary M.D.

## 2023-04-13 ENCOUNTER — Ambulatory Visit (INDEPENDENT_AMBULATORY_CARE_PROVIDER_SITE_OTHER): Payer: Medicare HMO | Admitting: Internal Medicine

## 2023-04-13 ENCOUNTER — Encounter: Payer: Self-pay | Admitting: Internal Medicine

## 2023-04-13 VITALS — BP 120/64 | HR 97 | Temp 97.8°F | Ht 63.0 in | Wt 205.8 lb

## 2023-04-13 DIAGNOSIS — K219 Gastro-esophageal reflux disease without esophagitis: Secondary | ICD-10-CM

## 2023-04-13 DIAGNOSIS — Z79899 Other long term (current) drug therapy: Secondary | ICD-10-CM | POA: Diagnosis not present

## 2023-04-13 DIAGNOSIS — Z0000 Encounter for general adult medical examination without abnormal findings: Secondary | ICD-10-CM | POA: Diagnosis not present

## 2023-04-13 DIAGNOSIS — R739 Hyperglycemia, unspecified: Secondary | ICD-10-CM

## 2023-04-13 DIAGNOSIS — E785 Hyperlipidemia, unspecified: Secondary | ICD-10-CM

## 2023-04-13 DIAGNOSIS — I10 Essential (primary) hypertension: Secondary | ICD-10-CM

## 2023-04-13 LAB — HEPATIC FUNCTION PANEL
ALT: 14 U/L (ref 0–35)
AST: 16 U/L (ref 0–37)
Albumin: 4 g/dL (ref 3.5–5.2)
Alkaline Phosphatase: 78 U/L (ref 39–117)
Bilirubin, Direct: 0.1 mg/dL (ref 0.0–0.3)
Total Bilirubin: 0.6 mg/dL (ref 0.2–1.2)
Total Protein: 7.3 g/dL (ref 6.0–8.3)

## 2023-04-13 LAB — LIPID PANEL
Cholesterol: 195 mg/dL (ref 0–200)
HDL: 69.8 mg/dL (ref >39.00)
LDL Cholesterol: 108 mg/dL — ABNORMAL HIGH (ref 0–99)
NonHDL: 125.05
Total CHOL/HDL Ratio: 3
Triglycerides: 83 mg/dL (ref 0.0–149.0)
VLDL: 16.6 mg/dL (ref 0.0–40.0)

## 2023-04-13 LAB — BASIC METABOLIC PANEL
BUN: 18 mg/dL (ref 6–23)
CO2: 32 meq/L (ref 19–32)
Calcium: 9.5 mg/dL (ref 8.4–10.5)
Chloride: 101 meq/L (ref 96–112)
Creatinine, Ser: 0.92 mg/dL (ref 0.40–1.20)
GFR: 62.68 mL/min (ref >60.00)
Glucose, Bld: 106 mg/dL — ABNORMAL HIGH (ref 70–99)
Potassium: 3.9 meq/L (ref 3.5–5.1)
Sodium: 140 meq/L (ref 135–145)

## 2023-04-13 LAB — MICROALBUMIN / CREATININE URINE RATIO
Creatinine,U: 105.8 mg/dL
Microalb Creat Ratio: 0.7 mg/g (ref 0.0–30.0)
Microalb, Ur: <0.7 mg/dL (ref 0.0–1.9)

## 2023-04-13 LAB — TSH: TSH: 1.67 u[IU]/mL (ref 0.35–5.50)

## 2023-04-13 LAB — SURGICAL PATHOLOGY

## 2023-04-13 LAB — HEMOGLOBIN A1C: Hgb A1c MFr Bld: 6 % (ref 4.6–6.5)

## 2023-04-13 NOTE — Patient Instructions (Addendum)
Good to see you today . Will let Dr C  fu the vit d and b12  in future  Continue hypertension control. Avoid limit  sugar beverages  try substitute with food. Ie done drink your calories .

## 2023-04-14 NOTE — Progress Notes (Signed)
Results stable  blood sugar in prediabetic range  Liver thyroid, kidney function  normal range

## 2023-04-22 ENCOUNTER — Other Ambulatory Visit (HOSPITAL_COMMUNITY)
Admission: RE | Admit: 2023-04-22 | Discharge: 2023-04-22 | Disposition: A | Discharge status: Home / Self Care | Payer: Medicare HMO | Source: Ambulatory Visit | Attending: Oncology | Admitting: Oncology

## 2023-04-22 DIAGNOSIS — E559 Vitamin D deficiency, unspecified: Secondary | ICD-10-CM | POA: Insufficient documentation

## 2023-04-22 DIAGNOSIS — E538 Deficiency of other specified B group vitamins: Secondary | ICD-10-CM | POA: Insufficient documentation

## 2023-04-22 DIAGNOSIS — Z006 Encounter for examination for normal comparison and control in clinical research program: Secondary | ICD-10-CM | POA: Insufficient documentation

## 2023-04-22 LAB — VITAMIN D 25 HYDROXY (VIT D DEFICIENCY, FRACTURES): Vit D, 25-Hydroxy: 47.59 ng/mL (ref 30–100)

## 2023-04-30 ENCOUNTER — Other Ambulatory Visit: Payer: Self-pay | Admitting: Family

## 2023-05-04 LAB — GENECONNECT MOLECULAR SCREEN: Genetic Analysis Overall Interpretation: NEGATIVE

## 2023-05-19 NOTE — Addendum Note (Signed)
Addended by: Richardson Chiquito on: 05/19/2023 12:03 PM   Modules accepted: Orders

## 2023-05-19 NOTE — Progress Notes (Signed)
Called and left message reminding patient to have labs completed 05/2023.

## 2023-06-08 ENCOUNTER — Other Ambulatory Visit (INDEPENDENT_AMBULATORY_CARE_PROVIDER_SITE_OTHER): Payer: Medicare HMO

## 2023-06-08 DIAGNOSIS — E538 Deficiency of other specified B group vitamins: Secondary | ICD-10-CM | POA: Diagnosis not present

## 2023-06-08 DIAGNOSIS — E559 Vitamin D deficiency, unspecified: Secondary | ICD-10-CM | POA: Diagnosis not present

## 2023-06-08 LAB — B12 AND FOLATE PANEL
Folate: >24.2 ng/mL (ref >5.9)
Vitamin B-12: 412 pg/mL (ref 211–911)

## 2023-08-26 ENCOUNTER — Other Ambulatory Visit: Payer: Self-pay | Admitting: Internal Medicine

## 2023-08-26 NOTE — Telephone Encounter (Signed)
 Copied from CRM [id number]. Topic: Clinical - Medication Refill >> Aug 26, 2023 10:45 AM Isabell A wrote: Most Recent Primary Care Visit:  Provider: Berniece Andreas K  Department: [facility]  Visit Type: PHYSICAL  Date: 04/13/2023  Medication: benazepril-hydrochlorthiazide (LOTENSIN HCT) 20-12.5 MG tablet  Has the patient contacted their pharmacy? Yes (Agent: If no, request that the patient contact the pharmacy for the refill. If patient does not wish to contact the pharmacy document the reason why and proceed with request.) (Agent: If yes, when and what did the pharmacy advise?)  Is this the correct pharmacy for this prescription? Yes If no, delete pharmacy and type the correct one.  This is the patient's preferred pharmacy:  [id number] [place], [place] - [address] [place] [address] Phone: [phone] Fax: [phone]   Has the prescription been filled recently? Yes  Is the patient out of the medication? Yes  Has the patient been seen for an appointment in the last year OR does the patient have an upcoming appointment? Yes  Can we respond through MyChart? No  Agent: Please be advised that Rx refills may take up to 3 business days. We ask that you follow-up with your pharmacy.

## 2023-08-27 MED ORDER — BENAZEPRIL-HYDROCHLOROTHIAZIDE 20-12.5 MG PO TABS: 1.0000 | ORAL_TABLET | Freq: Every day | ORAL | 1 refills | Status: DC

## 2023-10-04 ENCOUNTER — Telehealth: Payer: Self-pay

## 2023-10-04 ENCOUNTER — Other Ambulatory Visit: Payer: Self-pay

## 2023-10-04 DIAGNOSIS — E538 Deficiency of other specified B group vitamins: Secondary | ICD-10-CM

## 2023-10-04 NOTE — Telephone Encounter (Signed)
-----   Message from [facility] R sent at 06/10/2023 10:35 AM EST ----- Regarding: May 2025 Needs to have B12 and folate levels checked in May per Dr Tita Form

## 2023-10-04 NOTE — Telephone Encounter (Signed)
 Phone call to the patient to remind her that she will been Vitamin B12 and folate levels rechecked May per Dr Karene Oto.  Patient agreed to plan and verbalized understanding.  No further questions.

## 2023-10-05 DIAGNOSIS — M79604 Pain in right leg: Secondary | ICD-10-CM | POA: Diagnosis not present

## 2023-10-05 DIAGNOSIS — G2581 Restless legs syndrome: Secondary | ICD-10-CM | POA: Diagnosis not present

## 2023-10-05 DIAGNOSIS — I83893 Varicose veins of bilateral lower extremities with other complications: Secondary | ICD-10-CM | POA: Diagnosis not present

## 2023-10-05 DIAGNOSIS — L299 Pruritus, unspecified: Secondary | ICD-10-CM | POA: Diagnosis not present

## 2023-10-14 ENCOUNTER — Other Ambulatory Visit

## 2023-10-14 DIAGNOSIS — E538 Deficiency of other specified B group vitamins: Secondary | ICD-10-CM

## 2023-10-14 LAB — B12 AND FOLATE PANEL
Folate: 8.3 ng/mL (ref >5.9)
Vitamin B-12: 214 pg/mL (ref 211–911)

## 2023-10-15 ENCOUNTER — Other Ambulatory Visit: Payer: Self-pay

## 2023-10-15 MED ORDER — VITAMIN B-12 1000 MCG PO TABS
1000.0000 ug | ORAL_TABLET | Freq: Every day | ORAL | Status: AC
Start: 2023-10-15 — End: ?

## 2023-11-09 DIAGNOSIS — I83811 Varicose veins of right lower extremities with pain: Secondary | ICD-10-CM | POA: Diagnosis not present

## 2023-11-09 DIAGNOSIS — I83891 Varicose veins of right lower extremities with other complications: Secondary | ICD-10-CM | POA: Diagnosis not present

## 2023-11-09 DIAGNOSIS — M7989 Other specified soft tissue disorders: Secondary | ICD-10-CM | POA: Diagnosis not present

## 2023-11-16 DIAGNOSIS — M7989 Other specified soft tissue disorders: Secondary | ICD-10-CM | POA: Diagnosis not present

## 2023-11-16 DIAGNOSIS — I83812 Varicose veins of left lower extremities with pain: Secondary | ICD-10-CM | POA: Diagnosis not present

## 2023-11-16 DIAGNOSIS — I83892 Varicose veins of left lower extremities with other complications: Secondary | ICD-10-CM | POA: Diagnosis not present

## 2023-11-24 DIAGNOSIS — I83811 Varicose veins of right lower extremities with pain: Secondary | ICD-10-CM | POA: Diagnosis not present

## 2023-11-24 DIAGNOSIS — M7989 Other specified soft tissue disorders: Secondary | ICD-10-CM | POA: Diagnosis not present

## 2023-12-07 DIAGNOSIS — I83892 Varicose veins of left lower extremities with other complications: Secondary | ICD-10-CM | POA: Diagnosis not present

## 2023-12-07 DIAGNOSIS — I87392 Chronic venous hypertension (idiopathic) with other complications of left lower extremity: Secondary | ICD-10-CM | POA: Diagnosis not present

## 2023-12-14 DIAGNOSIS — I83891 Varicose veins of right lower extremities with other complications: Secondary | ICD-10-CM | POA: Diagnosis not present

## 2023-12-29 DIAGNOSIS — I83812 Varicose veins of left lower extremities with pain: Secondary | ICD-10-CM | POA: Diagnosis not present

## 2023-12-29 DIAGNOSIS — M7989 Other specified soft tissue disorders: Secondary | ICD-10-CM | POA: Diagnosis not present

## 2024-01-07 ENCOUNTER — Telehealth: Payer: Self-pay

## 2024-01-07 DIAGNOSIS — E538 Deficiency of other specified B group vitamins: Secondary | ICD-10-CM

## 2024-01-07 NOTE — Telephone Encounter (Signed)
 Left message for patient to return call to advise that she will need 3 month lab work rechecked.  Will continue efforts.

## 2024-01-07 NOTE — Telephone Encounter (Signed)
-----   Message from [facility] R sent at 10/15/2023  4:51 PM EDT ----- Regarding: August 2025 recheck Vitamin B12 level per Dr JAYSON

## 2024-01-07 NOTE — Telephone Encounter (Signed)
 Patient returned call and advised to repeat lab work at her convenience.  Patient agreed to plan and verbalized understanding.  No further questions.

## 2024-01-18 ENCOUNTER — Other Ambulatory Visit (INDEPENDENT_AMBULATORY_CARE_PROVIDER_SITE_OTHER)

## 2024-01-18 DIAGNOSIS — E538 Deficiency of other specified B group vitamins: Secondary | ICD-10-CM | POA: Diagnosis not present

## 2024-01-18 LAB — VITAMIN B12: Vitamin B-12: 439 pg/mL (ref 211–911)

## 2024-01-19 ENCOUNTER — Other Ambulatory Visit: Payer: Self-pay | Admitting: Internal Medicine

## 2024-01-19 DIAGNOSIS — Z1231 Encounter for screening mammogram for malignant neoplasm of breast: Secondary | ICD-10-CM

## 2024-01-25 ENCOUNTER — Ambulatory Visit: Payer: Self-pay | Admitting: Gastroenterology

## 2024-02-02 ENCOUNTER — Ambulatory Visit: Admitting: Gastroenterology

## 2024-02-03 DIAGNOSIS — H2513 Age-related nuclear cataract, bilateral: Secondary | ICD-10-CM | POA: Diagnosis not present

## 2024-02-03 DIAGNOSIS — H5203 Hypermetropia, bilateral: Secondary | ICD-10-CM | POA: Diagnosis not present

## 2024-02-08 DIAGNOSIS — Z0100 Encounter for examination of eyes and vision without abnormal findings: Secondary | ICD-10-CM | POA: Diagnosis not present

## 2024-02-11 ENCOUNTER — Ambulatory Visit
Admission: RE | Admit: 2024-02-11 | Discharge: 2024-02-11 | Disposition: A | Discharge status: Home / Self Care | Source: Ambulatory Visit | Attending: Internal Medicine | Admitting: Internal Medicine

## 2024-02-11 DIAGNOSIS — Z1231 Encounter for screening mammogram for malignant neoplasm of breast: Secondary | ICD-10-CM

## 2024-02-13 ENCOUNTER — Other Ambulatory Visit: Payer: Self-pay | Admitting: Internal Medicine

## 2024-02-19 ENCOUNTER — Other Ambulatory Visit: Payer: Self-pay | Admitting: Gastroenterology

## 2024-03-02 ENCOUNTER — Ambulatory Visit: Admitting: Gastroenterology

## 2024-03-02 ENCOUNTER — Encounter: Payer: Self-pay | Admitting: Gastroenterology

## 2024-03-02 VITALS — BP 136/76 | HR 68 | Ht 63.0 in | Wt 208.2 lb

## 2024-03-02 DIAGNOSIS — E538 Deficiency of other specified B group vitamins: Secondary | ICD-10-CM | POA: Diagnosis not present

## 2024-03-02 DIAGNOSIS — E559 Vitamin D deficiency, unspecified: Secondary | ICD-10-CM

## 2024-03-02 DIAGNOSIS — R142 Eructation: Secondary | ICD-10-CM

## 2024-03-02 DIAGNOSIS — K219 Gastro-esophageal reflux disease without esophagitis: Secondary | ICD-10-CM | POA: Diagnosis not present

## 2024-03-02 DIAGNOSIS — Z9049 Acquired absence of other specified parts of digestive tract: Secondary | ICD-10-CM | POA: Diagnosis not present

## 2024-03-02 DIAGNOSIS — R12 Heartburn: Secondary | ICD-10-CM

## 2024-03-02 MED ORDER — OMEPRAZOLE 40 MG PO CPDR: 40.0000 mg | DELAYED_RELEASE_CAPSULE | Freq: Every day | ORAL | 5 refills | Status: AC

## 2024-03-02 NOTE — Progress Notes (Signed)
 Chief Complaint:    GERD, medication refill  GI History: [name] is a 73 y.o. female with a history of HTN, diverticulosis, HSV, hemorrhoids, GERD, obesity, cholecystectomy 2006, C-section x 2, who follows in the GI clinic for the following:  She has a longstanding history of GERD for many years. Index symptoms of heartburn, sore throat, belching, with episodic nocturnal regurgitation. No dysphagia. Has been treated with omeprazole  in the past with good clinical response.  Dexilant was efficacious, but had to change back to omeprazole  due to insurance.  Eventually changed to Pepcid due to concerns about long-term side effects with PPIs.   No documented history of medication intolerance, allergy.  No history of osteoporosis, hip fracture, CKD.  No known history of vitamin deficiency.   Previous GI history: - 11/10/2004: Colonoscopy: Sigmoid diverticulosis, internal hemorrhoids, otherwise normal - 06/05/2011: EGD with Bravo placement: Normal esophagus, mild gastritis, normal duodenum - 06/05/2011: Bravo results not included in EMR - 11/16/2016: Colonoscopy: Sigmoid diverticulosis, otherwise normal.  Repeat 10 years - 02/26/2022: Last appointment with Dr. Kristie.  History of GERD along with bloating and belching.  Treated with famotidine 40 mg 1-2 times daily.  Was previously treated with omeprazole  40 mg daily with good control, but changed to Pepcid due to concerns about long-term side effects of PPIs.  Had previously been treated with Dexilant in 2014, but changed back to omeprazole  in 2019 due to no insurance coverage of Dexilant.  Reflux was not well-controlled with omeprazole  20 mg daily.  Uses MiraLAX on demand for occasional constipation.  Otherwise 1 BM daily.  Recommended 1 year follow-up - 03/02/2023: Initial appointment in [facility] to establish care.  Breakthrough reflux symptoms despite Pepcid 40 mg daily, and was changed back to omeprazole  40 mg daily and scheduled for EGD.  B12 154,  vitamin D  16.3, folate 5.9 and was started on B12, ergocalciferol , and folic acid .  Normal CBC, BMP, magnesium, iron indices - 04/09/2023: EGD: Normal esophagus with Hill grade 3 valve, mild antral gastritis (path benign), with otherwise normal duodenum - 04/2023: Vitamin D  47.6, B12 412, folate >24 - 10/2023: B12 214, folate 8.3.  B12 restarted - 01/2024: B12 439  HPI:     Patient is a 73 y.o. female presenting to the Gastroenterology Clinic for follow-up.  Was last seen in the office by me in 02/2023.  Was changed back to omeprazole  40 mg daily with good clinical response.  Labs at that time notable for combined B12, vitamin D , and folate deficiency and was started on B12, ergocalciferol , and folic acid  with good response on repeat labs.  However, B12 levels again declined after stopping B12 (and folic acid  supplement) on repeat labs in 10/2023, so supplemental B12 was restarted, again with good serologic response on repeat labs in 01/2024 and recommended continued B12 supplement.  Today, she states she otherwise feels well.  Will still have occasional belching, but otherwise reflux well-controlled with omeprazole  40 mg daily.  Requesting refill today.  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     FOLIC ACID  PO Take  by mouth daily.     ibuprofen (ADVIL) 200 MG tablet Take 200 mg by mouth as needed.     omeprazole  (PRILOSEC) 40 MG capsule TAKE 1 CAPSULE (40 MG TOTAL) BY MOUTH DAILY. 30 capsule 0   polyethylene glycol (MIRALAX / GLYCOLAX) 17 g packet Take 17 g by mouth daily. Every other day     Vitamin D , Ergocalciferol , (DRISDOL ) 1.25 MG (50000 UNIT) CAPS capsule Take 1 capsule (50,000 Units total) by mouth every 7 (seven) days. 8 capsule 0   No current facility-administered medications for this visit.    Physical Exam:     Ht 5' 3 (1.6 m)   Wt 208 lb 4 oz (94.5 kg)   LMP 06/08/2004   BMI 36.89 kg/m   GENERAL:  Pleasant female in NAD PSYCH: : Cooperative, normal affect Musculoskeletal:  Normal muscle tone, normal strength NEURO: Alert and oriented x 3, no focal neurologic deficits   IMPRESSION and PLAN:    1) GERD 2) Heartburn 3) Belching - Resume omeprazole  40 mg daily.  Refill placed today with 90-day supply and RF 5 - BMP check in December for annual labs  4) B12 deficiency 5) Folate deficiency 6) Vitamin D  deficiency Good response to supplemental B12, folic acid , and ergocalciferol .  Had return of B12 deficiency after stopping supplementation, which has since normalized again when restarting B12 supplement.  Suspect all related to acid suppression therapy.  Plan to continue B12 long-term - Continue B12 supplement - Repeat B12, folate, vitamin D  in December  RTC in 1 year or sooner as needed          Sandor LULLA Flatter ,DO, FACG 03/02/2024, 10:39 AM

## 2024-03-02 NOTE — Patient Instructions (Addendum)
 _______________________________________________________  If your blood pressure at your visit was 140/90 or greater, please contact your primary care physician to follow up on this.  _______________________________________________________  If you are age 72 or older, your body mass index should be between 23-30. Your Body mass index is 36.89 kg/m. If this is out of the aforementioned range listed, please consider follow up with your Primary Care Provider.  If you are age 61 or younger, your body mass index should be between 19-25. Your Body mass index is 36.89 kg/m. If this is out of the aformentioned range listed, please consider follow up with your Primary Care Provider.   ________________________________________________________  The [facility] providers would like to encourage you to use MYCHART to communicate with providers for non-urgent requests or questions.  Due to long hold times on the telephone, sending your provider a message by [facility] may be a faster and more efficient way to get a response.  Please allow 48 business hours for a response.  Please remember that this is for non-urgent requests.  _______________________________________________________  [facility] is using a team-based approach to care.  Your team is made up of your doctor and two to three APPS. Our APPS (Nurse Practitioners and Physician Assistants) work with your physician to ensure care continuity for you. They are fully qualified to address your health concerns and develop a treatment plan. They communicate directly with your gastroenterologist to care for you. Seeing the Advanced Practice Practitioners on your physician's team can help you by facilitating care more promptly, often allowing for earlier appointments, access to diagnostic testing, procedures, and other specialty referrals.   Your provider has requested that you go to the basement level for lab work in December 2025. Press B on the  elevator. The lab is located at the first door on the left as you exit the elevator.  Due to recent changes in healthcare laws, you may see the results of your imaging and laboratory studies on MyChart before your provider has had a chance to review them.  We understand that in some cases there may be results that are confusing or concerning to you. Not all laboratory results come back in the same time frame and the provider may be waiting for multiple results in order to interpret others.  Please give us  48 hours in order for your provider to thoroughly review all the results before contacting the office for clarification of your results.   It was a pleasure to see you today!  Vito Cirigliano, D.O.

## 2024-03-28 ENCOUNTER — Encounter: Payer: Self-pay | Admitting: Family Medicine

## 2024-03-28 ENCOUNTER — Ambulatory Visit: Admitting: Family Medicine

## 2024-03-28 DIAGNOSIS — Z0000 Encounter for general adult medical examination without abnormal findings: Secondary | ICD-10-CM

## 2024-03-28 NOTE — Patient Instructions (Addendum)
 I really enjoyed getting to talk with you today! I am available on Tuesdays and Thursdays for virtual visits if you have any questions or concerns, or if I can be of any further assistance.   CHECKLIST FROM ANNUAL WELLNESS VISIT:  -Follow up (please call to schedule if not scheduled after visit):   -yearly for annual wellness visit with primary care office  Here is a list of your preventive care/health maintenance measures and the plan for each if any are due:  PLAN For any measures below that may be due:    1. Can get labs at your upcoming in office visit.    2. Can get tetanus booster at the pharmacy. Please bring proof of receipt of this and other vaccines done at the pharmacy to our office so that we can accurately update your record.    3. Please let us  know when you wish to do the bone density test.   Health Maintenance  Topic Date Due   Diabetic kidney evaluation - Urine ACR  Never done   HEMOGLOBIN A1C  10/11/2023   Medicare Annual Wellness (AWV)  02/23/2024   Diabetic kidney evaluation - eGFR measurement  04/12/2024   COVID-19 Vaccine (8 - 2025-26 season) 04/13/2024 (Originally 02/07/2024)   DTaP/Tdap/Td (2 - Td or Tdap) 03/28/2025 (Originally 06/08/2017)   Mammogram  02/10/2026   Colonoscopy  11/17/2026   Pneumococcal Vaccine: 50+ Years  Completed   Influenza Vaccine  Completed   DEXA SCAN  Completed   Hepatitis C Screening  Completed   Zoster Vaccines- Shingrix  Completed   Meningococcal B Vaccine  Aged Out    -See a dentist at least yearly  -Get your eyes checked and then per your eye specialist's recommendations  -Other issues addressed today:   1. Please add one additional day of strength training if able.    2. Please consider reducing processed grains and replacing with whole grains and reducing or eliminating processed foods and drinks that contain added sweeteners. Increase protein to 60 grams per day and increase veggie serving to 3-4 per day.  -I have  included below further information regarding a healthy whole foods based diet, physical activity guidelines for adults, stress management and opportunities for social connections. I hope you find this information useful.   -----------------------------------------------------------------------------------------------------------------------------------------------------------------------------------------------------------------------------------------------------------    NUTRITION: -eat real food: lots of colorful vegetables (half the plate) and fruits -5-7 servings of vegetables and fruits per day (fresh or steamed is best), exp. 2 servings of vegetables with lunch and dinner and 2 servings of fruit per day. Berries and greens such as kale and collards are great choices.  -consume on a regular basis:  fresh fruits, fresh veggies, fish, nuts, seeds, healthy oils (such as olive oil, avocado oil), whole grains (make sure for bread/pasta/crackers/etc., that the first ingredient on label contains the word whole), legumes. -can eat small amounts of dairy and lean meat (no larger than the palm of your hand), but avoid processed meats such as ham, bacon, lunch meat, etc. -drink water -try to avoid fast food and pre-packaged foods, processed meat, ultra processed foods/beverages (donuts, candy, etc.) -most experts advise limiting sodium to < 2300mg  per day, should limit further is any chronic conditions such as high blood pressure, heart disease, diabetes, etc. The American Heart Association advised that < 1500mg  is is ideal -try to avoid foods/beverages that contain any ingredients with names you do not recognize  -try to avoid foods/beverages  with added sugar or sweeteners/sweets  -try  to avoid sweet drinks (including diet drinks): soda, juice, Gatorade, sweet tea, power drinks, diet drinks -try to avoid white rice, white bread, pasta (unless whole grain)  EXERCISE GUIDELINES FOR ADULTS: -if you  wish to increase your physical activity, do so gradually and with the approval of your doctor -STOP and seek medical care immediately if you have any chest pain, chest discomfort or trouble breathing when starting or increasing exercise  -move and stretch your body, legs, feet and arms when sitting for long periods -Physical activity guidelines for optimal health in adults: -get at least 150 minutes per week of moderate exercise (can talk, but not sing); this is about 20-30 minutes of sustained activity 5-7 days per week or two 10-15 minute episodes of sustained activity 5-7 days per week -do some muscle building/resistance training/strength training at least 2 days per week  -balance exercises 3+ days per week:   Stand somewhere where you have something sturdy to hold onto if you lose balance    1) lift up on toes, then back down, start with 5x per day and work up to 20x   2) stand and lift one leg straight out to the side so that foot is a few inches of the floor, start with 5x each side and work up to 20x each side   3) stand on one foot, start with 5 seconds each side and work up to 20 seconds on each side  If you need ideas or help with getting more active:  -Silver sneakers https://tools.silversneakers.com  -Walk with a Doc: http://www.duncan-williams.com/  -try to include resistance (weight lifting/strength building) and balance exercises twice per week: or the following link for ideas: http://castillo-powell.com/  BuyDucts.dk  STRESS MANAGEMENT: -can try meditating, or just sitting quietly with deep breathing while intentionally relaxing all parts of your body for 5 minutes daily -if you need further help with stress, anxiety or depression please follow up with your primary doctor or contact the wonderful folks at [facility]: [phone]  SOCIAL CONNECTIONS: -options in [place]  if you wish to engage in more social and exercise related activities:  -Silver sneakers https://tools.silversneakers.com  -Walk with a Doc: http://www.duncan-williams.com/  -Check out the [facility] Active Adults 50+ section on the [place] [employer] (hiking clubs, book clubs, cards and games, chess, exercise classes, aquatic classes and much more) - see the website for details: https://www.[place].gov/departments/parks-recreation/active-adults50  -YouTube has lots of exercise videos for different ages and abilities as well  -[facility] (a variety of indoor and outdoor inperson activities for adults). [phone]. [address]  -Virtual Online Classes (a variety of topics): see seniorplanet.org or call [phone]  -consider volunteering at a school, hospice center, church, senior center or elsewhere

## 2024-03-28 NOTE — Progress Notes (Signed)
 PATIENT CHECK-IN and HEALTH RISK ASSESSMENT QUESTIONNAIRE:  -completed by phone/video for upcoming Medicare Preventive Visit   Pre-Visit Check-in: 1)Vitals (height, wt, BP, etc) - record in vitals section for visit on day of visit Request home vitals (wt, BP, etc.) and enter into vitals, THEN update Vital Signs SmartPhrase below at the top of the HPI. See below.  2)Review and Update Medications, Allergies PMH, Surgeries, Social history in Epic 3)Hospitalizations in the last year with date/reason? No   4)Review and Update Care Team (patient's specialists) in Epic 5) Complete PHQ9 in Epic  6) Complete Fall Screening in Epic 7)Review all Health Maintenance Due and order if not done.  Medicare Wellness Patient Questionnaire:  Answer theses question about your habits: How often do you have a drink containing alcohol?yes, once a 2 months  How many drinks containing alcohol do you have on a typical day when you are drinking?1 drink  How often do you have six or more drinks on one occasion?No  Have you ever smoked?no  Quit date if applicable? Na   How many packs a day do/did you smoke? Na  Do you use smokeless tobacco?No  Do you use an illicit drugs?No  On average, how many days per week do you engage in moderate to strenuous exercise (like a brisk walk)?Yes, 2-3 days  On average, how many minutes do you engage in exercise at this level? 90 minutes, walks with a group of friends, and [facility] - some balance stuff too. Are you sexually active? No  Number of partners?Na  Typical breakfast: Toast  Typical lunch:Sandwich  Typical dinner:Cheese and crackers Typical snacks: Cookies   Beverages: water, sweet tea, coffee   Answer theses question about your everyday activities: Can you perform most household chores? yes  Are you deaf or have significant trouble hearing? No  Do you feel that you have a problem with memory? No  Do you  feel safe at home?Yes  Last dentist visit? 6 months ago  8. Do you have any difficulty performing your everyday activities?No  Are you having any difficulty walking, taking medications on your own, and or difficulty managing daily home needs?No  Do you have difficulty walking or climbing stairs?NO  Do you have difficulty dressing or bathing?No  Do you have difficulty doing errands alone such as visiting a doctor's office or shopping?No  Do you currently have any difficulty preparing food and eating?No  Do you currently have any difficulty using the toilet?No  Do you have any difficulty managing your finances?NO  Do you have any difficulties with housekeeping of managing your housekeeping?NO    Do you have Advanced Directives in place (Living Will, Healthcare Power or Attorney)? No - on to do list   Last eye Exam and location?3 Month ago, Dr. Arvella Haddock   Do you currently use prescribed or non-prescribed narcotic or opioid pain medications?no   Do you have a history or close family history of breast, ovarian, tubal or peritoneal cancer or a family member with BRCA (breast cancer susceptibility 1 and 2) gene mutations?No    Nurse/Assistant Credentials/time stamp: Rea RONAL Silvan CMA 11:18 am     ----------------------------------------------------------------------------------------------------------------------------------------------------------------------------------------------------------------------  Because this visit was a virtual/telehealth visit, some criteria may be missing or patient reported. Any vitals not documented were not able to be obtained and vitals that have been documented are patient reported.    MEDICARE ANNUAL PREVENTIVE VISIT WITH PROVIDER: (Welcome to  Medicare, initial annual wellness or annual wellness exam)  Virtual Visit via Video Note  I connected with [name] on 03/28/24 by a video enabled telemedicine application and verified that I am  speaking with the correct person using two identifiers.  Location patient: home Location provider:work or home office Persons participating in the virtual visit: patient, provider  Concerns and/or follow up today: no concerns, however she would like to lose weight. She exercises a fair amount and tries to not eat much. However, weight has not come down.    See HM section in Epic for other details of completed HM.    ROS: negative for report of fevers, unintentional weight loss, vision changes, vision loss, hearing loss or change, chest pain, sob, hemoptysis, melena, hematochezia, hematuria, falls, bleeding or bruising, thoughts of suicide or self harm, memory loss  Patient-completed extensive health risk assessment - reviewed and discussed with the patient: See Health Risk Assessment completed with patient prior to the visit either above or in recent phone note. This was reviewed in detailed with the patient today and appropriate recommendations, orders and referrals were placed as needed per Summary below and patient instructions.   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   Hx of varicella    Hypertension    Obesity     Past Surgical History:  Procedure Laterality Date   CESAREAN SECTION     2   CHOLECYSTECTOMY  06/09/2003   COLONOSCOPY     Dr Kristie around 2018   ESOPHAGOGASTRODUODENOSCOPY     Dr Kristie around 2009   TUBAL  LIGATION     BTL    Social History   Socioeconomic History   Marital status: Married    Spouse name: Not on file   Number of children: Not on file   Years of education: Not on file   Highest education level: Not on file  Occupational History   Occupation: Retired  Tobacco Use   Smoking status: Never    Passive exposure: Never   Smokeless tobacco: Never  Vaping Use   Vaping status: Never Used  Substance and Sexual Activity   Alcohol use: Yes    Alcohol/week: 0.0 standard drinks of alcohol    Comment: seldom   Drug use: No   Sexual activity: Not Currently    Partners: Male    Birth control/protection: Other-see comments    Comment: husband had prostate cancer and BTL  Other Topics Concern   Not on file  Social History Narrative   X-ray tech    Married household with 2 lives with husband and the dog negative ETS   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   Social alcohol no tobacco some caffeine    fa   G2P2   Social Drivers of Health   Financial Resource Strain: Low Risk  (04/13/2023)   Overall Financial Resource Strain (CARDIA)  Difficulty of Paying Living Expenses: Not hard at all  Food Insecurity: No Food Insecurity (02/18/2022)   Hunger Vital Sign    Worried About Running Out of Food in the Last Year: Never true    Ran Out of Food in the Last Year: Never true  Transportation Needs: No Transportation Needs (04/13/2023)   PRAPARE - Administrator, Civil Service (Medical): No    Lack of Transportation (Non-Medical): No  Physical Activity: Sufficiently Active (04/13/2023)   Exercise Vital Sign    Days of Exercise per Week: 3 days    Minutes of Exercise per Session: 60 min  Stress: No Stress Concern Present (04/13/2023)   [employer] of Occupational Health - Occupational Stress Questionnaire    Feeling of Stress : Not at all  Social Connections: Socially Integrated (04/13/2023)   Social Connection and Isolation  Panel    Frequency of Communication with Friends and Family: More than three times a week    Frequency of Social Gatherings with Friends and Family: More than three times a week    Attends Religious Services: More than 4 times per year    Active Member of [employer] or Organizations: Yes    Attends Engineer, structural: More than 4 times per year    Marital Status: Married  Catering manager Violence: Not At Risk (04/13/2023)   Humiliation, Afraid, Rape, and Kick questionnaire    Fear of Current or Ex-Partner: No    Emotionally Abused: No    Physically Abused: No    Sexually Abused: No    Family History  Problem Relation Age of Onset   Hyperlipidemia Mother    Hypertension Mother    COPD Mother        Deceased   Hypertension Father    Cancer - Other Father        Liver deceased   Liver cancer Father    Hypertension Sister    Stroke Sister    Arthritis Sister    Obstructive Sleep Apnea Sister        And secondary atrial fibrillation.   Colon cancer Neg Hx    Esophageal cancer Neg Hx    Breast cancer Neg Hx     Current Outpatient Medications on File Prior to Visit  Medication Sig Dispense Refill   benazepril -hydrochlorthiazide (LOTENSIN  HCT) 20-12.5 MG tablet TAKE 1 TABLET BY MOUTH EVERY DAY 90 tablet 1   cyanocobalamin  (VITAMIN B12) 1000 MCG tablet Take 1 tablet (1,000 mcg total) by mouth daily.     ibuprofen (ADVIL) 200 MG tablet Take 200 mg by mouth as needed.     omeprazole  (PRILOSEC) 40 MG capsule Take 1 capsule (40 mg total) by mouth daily. 90 capsule 5   polyethylene glycol (MIRALAX / GLYCOLAX) 17 g packet Take 17 g by mouth daily. Every other day     No current facility-administered medications on file prior to visit.    Allergies  Allergen Reactions   Neosporin Eczema Essentials [Colloidal Oatmeal]     rash       Physical Exam Vitals requested from patient and listed below if patient had equipment and was able to obtain at home for this virtual  visit: There were no vitals filed for this visit. Estimated body mass index is 36.89 kg/m as calculated from the following:   Height as of 03/02/24: 5' 3 (1.6 m).   Weight as of 03/02/24: 208 lb 4 oz (94.5 kg).  EKG (optional): deferred due to virtual visit  GENERAL: alert, oriented, no acute distress detected, full vision exam deferred due to pandemic and/or virtual encounter  HEENT: atraumatic, conjunttiva clear, no obvious abnormalities on inspection of external nose and ears  NECK: normal movements of the head and neck  LUNGS: on inspection no signs of respiratory distress, breathing rate appears normal, no obvious gross SOB, gasping or wheezing  CV: no obvious cyanosis  MS: moves all visible extremities without noticeable abnormality  PSYCH/NEURO: pleasant and cooperative, no obvious depression or anxiety, speech and thought processing grossly intact, Cognitive function grossly intact  Flowsheet Row Clinical Support from 03/28/2024 in [facility] [place]  PHQ-9 Total Score 2        03/28/2024   11:08 AM 04/13/2023   10:12 AM 02/23/2023   11:21 AM 04/06/2022    1:36 PM 02/18/2022    9:24 AM  Depression screen PHQ 2/9  Decreased Interest 0 0 0 0 0  Down, Depressed, Hopeless 0 0 0 0 0  PHQ - 2 Score 0 0 0 0 0  Altered sleeping 0 2 2 1    Tired, decreased energy 2 1 0 0   Change in appetite 0 1 0 0   Feeling bad or failure about yourself  0 0 0 0   Trouble concentrating 0 0 0 0   Moving slowly or fidgety/restless 0 0 0 0   Suicidal thoughts 0 0 0 0   PHQ-9 Score 2 4 2 1    Difficult doing work/chores  Not difficult at all Not difficult at all Not difficult at all        02/18/2022    9:26 AM 04/06/2022    1:36 PM 02/23/2023   11:20 AM 04/13/2023   10:12 AM 03/28/2024   11:08 AM  Fall Risk  Falls in the past year? 0 0 0 0 0  Was there an injury with Fall? 0 0 0 0 0  Fall Risk Category Calculator 0 0 0 0 0  Fall Risk Category (Retired) Low   Low      (RETIRED) Patient Fall Risk Level Low fall risk  Low fall risk      Patient at Risk for Falls Due to No Fall Risks No Fall Risks No Fall Risks No Fall Risks No Fall Risks  Fall risk Follow up Falls prevention discussed  Falls evaluation completed  Falls evaluation completed Falls evaluation completed Falls evaluation completed     Data saved with a previous flowsheet row definition     SUMMARY AND PLAN:  Encounter for Medicare annual wellness exam   Discussed applicable health maintenance/preventive health measures and advised and referred or ordered per patient preferences: -discussed labs due and she plans to do at upcoming visit in the office with Dr. Charlett. Reviewed prior labs and discussed reversing prediabetes with lifestyle changes - see below. -discussed vaccines due and she plans to get the tdap at the pharmacy - agrees to bring proof of receipt to appt. She had flu and covid vaccines and agrees to obtain proof of receipt for theses as well. -reviewed last bone density and discussed bone building exercise and adequate vit D and calcium. She declined repeating for now, but says she will consider for when she gets her mammogram next year.  Health Maintenance  Topic Date Due   Diabetic kidney evaluation - Urine ACR  Never done   HEMOGLOBIN A1C  10/11/2023   Diabetic kidney evaluation - eGFR measurement  04/12/2024   COVID-19 Vaccine (8 -  2025-26 season) 04/13/2024 (Originally 02/07/2024)   DTaP/Tdap/Td (2 - Td or Tdap) 03/28/2025 (Originally 06/08/2017)   Medicare Annual Wellness (AWV)  03/28/2025   Mammogram  02/10/2026   Colonoscopy  11/17/2026   Pneumococcal Vaccine: 50+ Years  Completed   Influenza Vaccine  Completed   DEXA SCAN  Completed   Hepatitis C Screening  Completed   Zoster Vaccines- Shingrix  Completed   Meningococcal B Vaccine  Aged Out      Education and counseling on the following was provided based on the above review of health and a plan/checklist  for the patient, along with additional information discussed, was provided for the patient in the patient instructions :  -Advised on importance of completing advanced directives, discussed options for completing and provided information in patient instructions as well -Advised and counseled on a healthy lifestyle - including the importance of a healthy diet, regular physical activity, social connections -Reviewed patient's current diet. Advised and counseled on a whole foods based healthy diet. Specifically discussed food to avoid and foods to consume to decrease blood sugar and even out blood sugar and to balance metabolism. Discussed avoiding ultraprocessed grains (and how to look out for theses sometimes hidden in common foods) and foods/drinks with added sweeteners. Discussed alternatives. Discussed consuming enough protein and veggies and discussed healthy options. A summary of a healthy diet was provided in the Patient Instructions. Warned if using protein supplements about possibility of contaminants and quality issues and to check with a 3rd party such as consumer labs first if decides to use. Advised preferably to get nutrients and proteins from a healthy whole foods diet rather than supplements if possible.  -reviewed patient's current physical activity level and discussed exercise guidelines for adults. Congratulated on healthy habits. Advised adding at least one more day of strength training. Discussed community resources and ideas for safe exercise at home to assist in meeting exercise guideline recommendations in a safe and healthy way.  -Advise yearly dental visits at minimum and regular eye exams -Advised and counseled on alcohol safe limits Follow up: see patient instructions     Patient Instructions  I really enjoyed getting to talk with you today! I am available on Tuesdays and Thursdays for virtual visits if you have any questions or concerns, or if I can be of any further  assistance.   CHECKLIST FROM ANNUAL WELLNESS VISIT:  -Follow up (please call to schedule if not scheduled after visit):   -yearly for annual wellness visit with primary care office  Here is a list of your preventive care/health maintenance measures and the plan for each if any are due:  PLAN For any measures below that may be due:    1. Can get labs at your upcoming in office visit.    2. Can get tetanus booster at the pharmacy. Please bring proof of receipt of this and other vaccines done at the pharmacy to our office so that we can accurately update your record.    3. Please let us  know when you wish to do the bone density test.   Health Maintenance  Topic Date Due   Diabetic kidney evaluation - Urine ACR  Never done   HEMOGLOBIN A1C  10/11/2023   Medicare Annual Wellness (AWV)  02/23/2024   Diabetic kidney evaluation - eGFR measurement  04/12/2024   COVID-19 Vaccine (8 - 2025-26 season) 04/13/2024 (Originally 02/07/2024)   DTaP/Tdap/Td (2 - Td or Tdap) 03/28/2025 (Originally 06/08/2017)   Mammogram  02/10/2026   Colonoscopy  11/17/2026   Pneumococcal Vaccine: 50+ Years  Completed   Influenza Vaccine  Completed   DEXA SCAN  Completed   Hepatitis C Screening  Completed   Zoster Vaccines- Shingrix  Completed   Meningococcal B Vaccine  Aged Out    -See a dentist at least yearly  -Get your eyes checked and then per your eye specialist's recommendations  -Other issues addressed today:   1. Please add one additional day of strength training if able.    2. Please consider reducing processed grains and replacing with whole grains and reducing or eliminating processed foods and drinks that contain added sweeteners. Increase protein to 60 grams per day and increase veggie serving to 3-4 per day.  -I have included below further information regarding a healthy whole foods based diet, physical activity guidelines for adults, stress management and opportunities for social connections. I  hope you find this information useful.   -----------------------------------------------------------------------------------------------------------------------------------------------------------------------------------------------------------------------------------------------------------    NUTRITION: -eat real food: lots of colorful vegetables (half the plate) and fruits -5-7 servings of vegetables and fruits per day (fresh or steamed is best), exp. 2 servings of vegetables with lunch and dinner and 2 servings of fruit per day. Berries and greens such as kale and collards are great choices.  -consume on a regular basis:  fresh fruits, fresh veggies, fish, nuts, seeds, healthy oils (such as olive oil, avocado oil), whole grains (make sure for bread/pasta/crackers/etc., that the first ingredient on label contains the word whole), legumes. -can eat small amounts of dairy and lean meat (no larger than the palm of your hand), but avoid processed meats such as ham, bacon, lunch meat, etc. -drink water -try to avoid fast food and pre-packaged foods, processed meat, ultra processed foods/beverages (donuts, candy, etc.) -most experts advise limiting sodium to < 2300mg  per day, should limit further is any chronic conditions such as high blood pressure, heart disease, diabetes, etc. The American Heart Association advised that < 1500mg  is is ideal -try to avoid foods/beverages that contain any ingredients with names you do not recognize  -try to avoid foods/beverages  with added sugar or sweeteners/sweets  -try to avoid sweet drinks (including diet drinks): soda, juice, Gatorade, sweet tea, power drinks, diet drinks -try to avoid white rice, white bread, pasta (unless whole grain)  EXERCISE GUIDELINES FOR ADULTS: -if you wish to increase your physical activity, do so gradually and with the approval of your doctor -STOP and seek medical care immediately if you have any chest pain, chest discomfort or  trouble breathing when starting or increasing exercise  -move and stretch your body, legs, feet and arms when sitting for long periods -Physical activity guidelines for optimal health in adults: -get at least 150 minutes per week of moderate exercise (can talk, but not sing); this is about 20-30 minutes of sustained activity 5-7 days per week or two 10-15 minute episodes of sustained activity 5-7 days per week -do some muscle building/resistance training/strength training at least 2 days per week  -balance exercises 3+ days per week:   Stand somewhere where you have something sturdy to hold onto if you lose balance    1) lift up on toes, then back down, start with 5x per day and work up to 20x   2) stand and lift one leg straight out to the side so that foot is a few inches of the floor, start with 5x each side and work up to 20x each side   3) stand on one foot, start with  5 seconds each side and work up to 20 seconds on each side  If you need ideas or help with getting more active:  -Silver sneakers https://tools.silversneakers.com  -Walk with a Doc: http://www.duncan-williams.com/  -try to include resistance (weight lifting/strength building) and balance exercises twice per week: or the following link for ideas: http://castillo-powell.com/  BuyDucts.dk  STRESS MANAGEMENT: -can try meditating, or just sitting quietly with deep breathing while intentionally relaxing all parts of your body for 5 minutes daily -if you need further help with stress, anxiety or depression please follow up with your primary doctor or contact the wonderful folks at [facility]: [phone]  SOCIAL CONNECTIONS: -options in [place] if you wish to engage in more social and exercise related activities:  -Silver sneakers https://tools.silversneakers.com  -Walk with a Doc: http://www.duncan-williams.com/  -Check  out the [employer] Active Adults 50+ section on the [place] [employer] (hiking clubs, book clubs, cards and games, chess, exercise classes, aquatic classes and much more) - see the website for details: https://www.[place].gov/departments/parks-recreation/active-adults50  -YouTube has lots of exercise videos for different ages and abilities as well  -[facility] (a variety of indoor and outdoor inperson activities for adults). 769-321-7465. [address].  -Virtual Online Classes (a variety of topics): see seniorplanet.org or call [phone]  -consider volunteering at a school, hospice center, church, senior center or elsewhere            Chiquita JONELLE Cramp, DO

## 2024-04-11 DIAGNOSIS — I83893 Varicose veins of bilateral lower extremities with other complications: Secondary | ICD-10-CM | POA: Diagnosis not present

## 2024-04-11 DIAGNOSIS — I872 Venous insufficiency (chronic) (peripheral): Secondary | ICD-10-CM | POA: Diagnosis not present

## 2024-04-13 ENCOUNTER — Ambulatory Visit: Payer: Medicare HMO | Admitting: Internal Medicine

## 2024-04-13 ENCOUNTER — Encounter: Payer: Self-pay | Admitting: Internal Medicine

## 2024-04-13 VITALS — BP 120/60 | HR 65 | Temp 97.4°F | Ht 62.48 in | Wt 207.2 lb

## 2024-04-13 DIAGNOSIS — I10 Essential (primary) hypertension: Secondary | ICD-10-CM | POA: Diagnosis not present

## 2024-04-13 DIAGNOSIS — R739 Hyperglycemia, unspecified: Secondary | ICD-10-CM | POA: Diagnosis not present

## 2024-04-13 DIAGNOSIS — Z79899 Other long term (current) drug therapy: Secondary | ICD-10-CM

## 2024-04-13 DIAGNOSIS — Z6837 Body mass index (BMI) 37.0-37.9, adult: Secondary | ICD-10-CM

## 2024-04-13 DIAGNOSIS — Z0000 Encounter for general adult medical examination without abnormal findings: Secondary | ICD-10-CM

## 2024-04-13 DIAGNOSIS — E66812 Obesity, class 2: Secondary | ICD-10-CM | POA: Diagnosis not present

## 2024-04-13 LAB — CBC WITH DIFFERENTIAL/PLATELET
Basophils Absolute: 0 K/uL (ref 0.0–0.1)
Basophils Relative: 0.6 % (ref 0.0–3.0)
Eosinophils Absolute: 0.3 K/uL (ref 0.0–0.7)
Eosinophils Relative: 4.4 % (ref 0.0–5.0)
HCT: 36.1 % (ref 36.0–46.0)
Hemoglobin: 12.4 g/dL (ref 12.0–15.0)
Lymphocytes Relative: 25.9 % (ref 12.0–46.0)
Lymphs Abs: 1.8 K/uL (ref 0.7–4.0)
MCHC: 34.5 g/dL (ref 30.0–36.0)
MCV: 87.1 fl (ref 78.0–100.0)
Monocytes Absolute: 0.4 K/uL (ref 0.1–1.0)
Monocytes Relative: 5.1 % (ref 3.0–12.0)
Neutro Abs: 4.6 K/uL (ref 1.4–7.7)
Neutrophils Relative %: 64 % (ref 43.0–77.0)
Platelets: 321 K/uL (ref 150.0–400.0)
RBC: 4.14 Mil/uL (ref 3.87–5.11)
RDW: 12.6 % (ref 11.5–15.5)
WBC: 7.2 K/uL (ref 4.0–10.5)

## 2024-04-13 LAB — COMPREHENSIVE METABOLIC PANEL WITH GFR
ALT: 15 U/L (ref 0–35)
AST: 17 U/L (ref 0–37)
Albumin: 4.2 g/dL (ref 3.5–5.2)
Alkaline Phosphatase: 83 U/L (ref 39–117)
BUN: 24 mg/dL — ABNORMAL HIGH (ref 6–23)
CO2: 31 meq/L (ref 19–32)
Calcium: 9.4 mg/dL (ref 8.4–10.5)
Chloride: 100 meq/L (ref 96–112)
Creatinine, Ser: 0.88 mg/dL (ref 0.40–1.20)
GFR: 65.65 mL/min (ref >60.00)
Glucose, Bld: 103 mg/dL — ABNORMAL HIGH (ref 70–99)
Potassium: 3.8 meq/L (ref 3.5–5.1)
Sodium: 140 meq/L (ref 135–145)
Total Bilirubin: 0.5 mg/dL (ref 0.2–1.2)
Total Protein: 7.5 g/dL (ref 6.0–8.3)

## 2024-04-13 LAB — LIPID PANEL
Cholesterol: 209 mg/dL — ABNORMAL HIGH (ref 0–200)
HDL: 66.7 mg/dL (ref >39.00)
LDL Cholesterol: 121 mg/dL — ABNORMAL HIGH (ref 0–99)
NonHDL: 142.19
Total CHOL/HDL Ratio: 3
Triglycerides: 107 mg/dL (ref 0.0–149.0)
VLDL: 21.4 mg/dL (ref 0.0–40.0)

## 2024-04-13 LAB — MICROALBUMIN / CREATININE URINE RATIO
Creatinine,U: 112.3 mg/dL
Microalb Creat Ratio: UNDETERMINED mg/g (ref 0.0–30.0)
Microalb, Ur: <0.7 mg/dL

## 2024-04-13 LAB — TSH: TSH: 1.47 u[IU]/mL (ref 0.35–5.50)

## 2024-04-13 LAB — HEMOGLOBIN A1C: Hgb A1c MFr Bld: 6.2 % (ref 4.6–6.5)

## 2024-04-13 NOTE — Progress Notes (Signed)
 Chief Complaint  Patient presents with   Annual Exam    HPI: Patient  [name]  73 y.o. comes in today for Preventive Health Care visit  And Chronic disease management HT:  no se of meds  no concerns   Health Maintenance  Topic Date Due   COVID-19 Vaccine (8 - 2025-26 season) 02/07/2024   DTaP/Tdap/Td (2 - Td or Tdap) 03/28/2025 (Originally 06/08/2017)   HEMOGLOBIN A1C  10/11/2024   Medicare Annual Wellness (AWV)  03/28/2025   Diabetic kidney evaluation - eGFR measurement  04/13/2025   Diabetic kidney evaluation - Urine ACR  04/13/2025   Mammogram  02/10/2026   Colonoscopy  11/17/2026   Pneumococcal Vaccine: 50+ Years  Completed   Influenza Vaccine  Completed   DEXA SCAN  Completed   Hepatitis C Screening  Completed   Zoster Vaccines- Shingrix  Completed   Meningococcal B Vaccine  Aged Out   Health Maintenance Review LIFESTYLE:  Exercise:   [facility]  Tobacco/ETS: n Alcohol:   ocass  Sugar beverages:cut back  1 per day  tea sweet  Sleep: 6-7   Drug use: no HH of  2  1 pet    ROS:  GEN/ HEENT: No fever, significant weight changes sweats headaches vision problems hearing changes, CV/ PULM; No chest pain shortness of breath cough, syncope,edema  change in exercise tolerance. GI /GU: No adominal pain, vomiting, change in bowel habits. No blood in the stool. No significant GU symptoms. SKIN/HEME: ,no acute skin rashes suspicious lesions or bleeding. No lymphadenopathy, nodules, masses.  NEURO/ PSYCH:  No neurologic signs such as weakness numbness. No depression anxiety. IMM/ Allergy: No unusual infections.  Allergy .   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   Hx of varicella    Hypertension    Obesity      Past Surgical History:  Procedure Laterality Date   CESAREAN SECTION     2   CHOLECYSTECTOMY  06/09/2003   COLONOSCOPY     Dr Kristie around 2018   ESOPHAGOGASTRODUODENOSCOPY     Dr Kristie around 2009   TUBAL LIGATION     BTL    Family History  Problem Relation Age of Onset   Hyperlipidemia Mother    Hypertension Mother    COPD Mother        Deceased   Hypertension Father    Cancer - Other Father        Liver deceased   Liver cancer Father    Hypertension Sister    Stroke Sister    Arthritis Sister    Obstructive Sleep Apnea Sister        And secondary atrial fibrillation.   Colon cancer Neg Hx    Esophageal cancer Neg Hx    Breast cancer Neg Hx     Social History   Socioeconomic History   Marital status: Married    Spouse name: Not on file   Number of children: Not on file   Years of education: Not on file   Highest education level: Not on file  Occupational History   Occupation: Retired  Tobacco Use   Smoking status: Never    Passive exposure:  Never   Smokeless tobacco: Never  Vaping Use   Vaping status: Never Used  Substance and Sexual Activity   Alcohol use: Yes    Alcohol/week: 0.0 standard drinks of alcohol    Comment: seldom   Drug use: No   Sexual activity: Not Currently    Partners: Male    Birth control/protection: Other-see comments    Comment: husband had prostate cancer and BTL  Other Topics Concern   Not on file  Social History Narrative   X-ray tech    Married household with 2 lives with husband and the dog negative ETS   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   Social alcohol no tobacco some caffeine    fa   G2P2   Social Drivers of Health   Financial Resource Strain: Low Risk  (04/13/2023)   Overall Financial Resource Strain (CARDIA)    Difficulty of Paying Living Expenses: Not hard at all  Food Insecurity: No Food Insecurity (02/18/2022)   Hunger Vital Sign    Worried About Running Out of  Food in the Last Year: Never true    Ran Out of Food in the Last Year: Never true  Transportation Needs: No Transportation Needs (04/13/2023)   PRAPARE - Administrator, Civil Service (Medical): No    Lack of Transportation (Non-Medical): No  Physical Activity: Sufficiently Active (04/13/2023)   Exercise Vital Sign    Days of Exercise per Week: 3 days    Minutes of Exercise per Session: 60 min  Stress: No Stress Concern Present (04/13/2023)   [employer] of Occupational Health - Occupational Stress Questionnaire    Feeling of Stress : Not at all  Social Connections: Socially Integrated (04/13/2023)   Social Connection and Isolation Panel    Frequency of Communication with Friends and Family: More than three times a week    Frequency of Social Gatherings with Friends and Family: More than three times a week    Attends Religious Services: More than 4 times per year    Active Member of [employer] or Organizations: Yes    Attends Engineer, Structural: More than 4 times per year    Marital Status: Married    Outpatient Medications Prior to Visit  Medication Sig Dispense Refill   benazepril -hydrochlorthiazide (LOTENSIN  HCT) 20-12.5 MG tablet TAKE 1 TABLET BY MOUTH EVERY DAY 90 tablet 1   cyanocobalamin  (VITAMIN B12) 1000 MCG tablet Take 1 tablet (1,000 mcg total) by mouth daily.     ibuprofen (ADVIL) 200 MG tablet Take 200 mg by mouth as needed.     omeprazole  (PRILOSEC) 40 MG capsule Take 1 capsule (40 mg total) by mouth daily. 90 capsule 5   polyethylene glycol (MIRALAX / GLYCOLAX) 17 g packet Take 17 g by mouth daily. Every other day     No facility-administered medications prior to visit.     EXAM:  BP 120/60 (BP Location: Left Arm, Patient Position: Sitting, Cuff Size: Large)   Pulse 65   Temp (!) 97.4 F (36.3 C) (Oral)   Ht 5' 2.48 (1.587 m)   Wt 207 lb 3.2 oz (94 kg)   LMP 06/08/2004   SpO2 97%   BMI 37.32 kg/m   Body mass index is 37.32 kg/m. Wt  Readings from Last 3 Encounters:  04/13/24 207 lb 3.2 oz (94 kg)  03/02/24 208 lb 4 oz (94.5 kg)  04/13/23 205 lb 12.8 oz (93.4 kg)  Physical Exam: Vital signs reviewed HZW:Uypd is a well-developed well-nourished alert cooperative    who appearsr stated age in no acute distress.  HEENT: normocephalic atraumatic , Eyes: PERRL EOM's full, conjunctiva clear, Nares: paten,t no deformity discharge or tenderness., Ears: no deformity EAC's clear TMs with normal landmarks. Mouth: clear OP, no lesions, edema.  Moist mucous membranes. Dentition in adequate repair. NECK: supple without masses, thyromegaly or bruits. CHEST/PULM:  Clear to auscultation and percussion breath sounds equal no wheeze , rales or rhonchi. No chest wall deformities or tenderness. Breast: normal by inspection . No dimpling, discharge, masses, tenderness or discharge . CV: PMI is nondisplaced, S1 S2 no gallops, murmurs, rubs. Peripheral pulses are full without delay.No JVD .  ABDOMEN: Bowel sounds normal nontender  No guard or rebound, no hepato splenomegal no CVA tenderness. . Extremtities:  No clubbing cyanosis or edema, no acute joint swelling or redness no focal atrophy NEURO:  Oriented x3, cranial nerves 3-12 appear to be intact, no obvious focal weakness,gait within normal limits no abnormal reflexes or asymmetrical SKIN: No acute rashes normal turgor, color, no bruising or petechiae. PSYCH: Oriented, good eye contact, no obvious depression anxiety, cognition and judgment appear normal. LN: no cervical axillary adenopathy  Lab Results  Component Value Date   WBC 7.2 04/13/2024   HGB 12.4 04/13/2024   HCT 36.1 04/13/2024   PLT 321.0 04/13/2024   GLUCOSE 103 (H) 04/13/2024   CHOL 209 (H) 04/13/2024   TRIG 107.0 04/13/2024   HDL 66.70 04/13/2024   LDLCALC 121 (H) 04/13/2024   ALT 15 04/13/2024   AST 17 04/13/2024   NA 140 04/13/2024   K 3.8 04/13/2024   CL 100 04/13/2024   CREATININE 0.88 04/13/2024   BUN 24 (H)  04/13/2024   CO2 31 04/13/2024   TSH 1.47 04/13/2024   HGBA1C 6.2 04/13/2024   MICROALBUR <0.7 04/13/2024    BP Readings from Last 3 Encounters:  04/13/24 120/60  03/02/24 136/76  04/13/23 120/64    Lab plan  reviewed with patient   ASSESSMENT AND PLAN:  Discussed the following assessment and plan:    ICD-10-CM   1. Visit for preventive health examination  Z00.00     2. Medication management  Z79.899 CBC with Differential/Platelet    Comprehensive metabolic panel with GFR    Lipid panel    TSH    Hemoglobin A1c    Microalbumin/Creatinine Ratio, Urine    3. Essential hypertension  I10 CBC with Differential/Platelet    Comprehensive metabolic panel with GFR    Lipid panel    TSH    Hemoglobin A1c    Microalbumin/Creatinine Ratio, Urine    4. Hyperglycemia  R73.9 CBC with Differential/Platelet    Comprehensive metabolic panel with GFR    Lipid panel    TSH    Hemoglobin A1c    Microalbumin/Creatinine Ratio, Urine    5. Class 2 severe obesity with serious comorbidity and body mass index (BMI) of 37.0 to 37.9 in adult, unspecified obesity type  E66.812 CBC with Differential/Platelet   Z68.37 Comprehensive metabolic panel with GFR    Lipid panel    TSH    Hemoglobin A1c    Microalbumin/Creatinine Ratio, Urine    Update  lab review  HCM Contnue medication BP control  LSoptimization Return in about 1 year (around 04/13/2025) for depending on results.  Patient Care Team: [name], Kimberly POUR, MD as PCP - General (Internal Medicine) Kristie Lamprey, MD as Attending Physician (Gastroenterology) Waylan Cain,  MD as Consulting Physician (Ophthalmology) Cleotilde Ronal RAMAN, MD as Consulting Physician (Gynecology) San Sandor GAILS, DO as Consulting Physician (Gastroenterology) Patient Instructions  Good to see you today . Continue lifestyle intervention healthy eating and exercise .  Optimization.  Avoid sweet beverages  or  limiting .   Update labs monitoring  today .  SABRA  Doneshia Hill K. Nyasia Baxley M.D.

## 2024-04-13 NOTE — Patient Instructions (Addendum)
 Good to see you today . Continue lifestyle intervention healthy eating and exercise .  Optimization.  Avoid sweet beverages  or  limiting .   Update labs monitoring  today . SABRA

## 2024-04-16 ENCOUNTER — Ambulatory Visit: Payer: Self-pay | Admitting: Internal Medicine

## 2024-04-16 NOTE — Progress Notes (Signed)
 Kidney function normal ,thyroid  normal  blood sugar borderline up  A1c 6.2 prediabetic range  stable. Cholesterol l up some . You could consider adding  statin medication  Modest Healthy weight loss may help prevent diabetes and  improve cholesterol level .     The 10-year ASCVD risk score (Arnett DK, et al., 2019) is: 24.7%   Values used to calculate the score:     Age: 73 years     Clincally relevant sex: Female     Is Non-Hispanic African American: No     Diabetic: Yes     Tobacco smoker: No     Systolic Blood Pressure: 120 mmHg     Is BP treated: Yes     HDL Cholesterol: 66.7 mg/dL     Total Cholesterol: 209 mg/dL

## 2024-05-16 DIAGNOSIS — I83891 Varicose veins of right lower extremities with other complications: Secondary | ICD-10-CM | POA: Diagnosis not present

## 2024-06-06 ENCOUNTER — Other Ambulatory Visit (INDEPENDENT_AMBULATORY_CARE_PROVIDER_SITE_OTHER)

## 2024-06-06 ENCOUNTER — Ambulatory Visit: Payer: Self-pay | Admitting: Gastroenterology

## 2024-06-06 DIAGNOSIS — E538 Deficiency of other specified B group vitamins: Secondary | ICD-10-CM

## 2024-06-06 DIAGNOSIS — E559 Vitamin D deficiency, unspecified: Secondary | ICD-10-CM

## 2024-06-06 DIAGNOSIS — K219 Gastro-esophageal reflux disease without esophagitis: Secondary | ICD-10-CM | POA: Diagnosis not present

## 2024-06-06 LAB — BASIC METABOLIC PANEL WITH GFR
BUN: 26 mg/dL — ABNORMAL HIGH (ref 6–23)
CO2: 32 meq/L (ref 19–32)
Calcium: 9.2 mg/dL (ref 8.4–10.5)
Chloride: 102 meq/L (ref 96–112)
Creatinine, Ser: 0.93 mg/dL (ref 0.40–1.20)
GFR: 61.37 mL/min
Glucose, Bld: 98 mg/dL (ref 70–99)
Potassium: 3.9 meq/L (ref 3.5–5.1)
Sodium: 141 meq/L (ref 135–145)

## 2024-06-06 LAB — VITAMIN D 25 HYDROXY (VIT D DEFICIENCY, FRACTURES): VITD: 25.65 ng/mL — ABNORMAL LOW (ref 30.00–100.00)

## 2024-06-06 LAB — B12 AND FOLATE PANEL
Folate: 5.5 ng/mL — ABNORMAL LOW
Vitamin B-12: 967 pg/mL — ABNORMAL HIGH (ref 211–911)

## 2024-06-09 MED ORDER — VITAMIN D (ERGOCALCIFEROL) 1.25 MG (50000 UNIT) PO CAPS: 50000.0000 [IU] | ORAL_CAPSULE | ORAL | 0 refills | Status: AC

## 2024-06-22 ENCOUNTER — Encounter: Payer: Self-pay | Admitting: Internal Medicine

## 2024-06-22 DIAGNOSIS — I10 Essential (primary) hypertension: Secondary | ICD-10-CM

## 2024-06-22 DIAGNOSIS — R739 Hyperglycemia, unspecified: Secondary | ICD-10-CM

## 2024-06-22 DIAGNOSIS — Z6837 Body mass index (BMI) 37.0-37.9, adult: Secondary | ICD-10-CM

## 2024-06-22 DIAGNOSIS — E785 Hyperlipidemia, unspecified: Secondary | ICD-10-CM

## 2024-06-28 NOTE — Telephone Encounter (Signed)
 Sure  ok to refer to weight management .  For help  they can prescribe  medication also

## 2024-07-11 DIAGNOSIS — Z0289 Encounter for other administrative examinations: Secondary | ICD-10-CM

## 2024-07-12 ENCOUNTER — Encounter (INDEPENDENT_AMBULATORY_CARE_PROVIDER_SITE_OTHER): Payer: Self-pay | Admitting: Internal Medicine

## 2024-07-12 ENCOUNTER — Ambulatory Visit (INDEPENDENT_AMBULATORY_CARE_PROVIDER_SITE_OTHER): Admitting: Internal Medicine

## 2024-07-12 VITALS — BP 134/84 | HR 64 | Temp 98.0°F | Ht 62.0 in | Wt 202.0 lb

## 2024-07-12 DIAGNOSIS — Z6837 Body mass index (BMI) 37.0-37.9, adult: Secondary | ICD-10-CM

## 2024-07-12 DIAGNOSIS — E66812 Obesity, class 2: Secondary | ICD-10-CM | POA: Diagnosis not present

## 2024-07-12 DIAGNOSIS — I10 Essential (primary) hypertension: Secondary | ICD-10-CM

## 2024-07-12 DIAGNOSIS — R7303 Prediabetes: Secondary | ICD-10-CM

## 2024-07-12 DIAGNOSIS — E7800 Pure hypercholesterolemia, unspecified: Secondary | ICD-10-CM

## 2024-07-12 DIAGNOSIS — R944 Abnormal results of kidney function studies: Secondary | ICD-10-CM

## 2024-07-12 NOTE — Progress Notes (Unsigned)
 " [address] [place], [place], [place] [address] Office: [phone]  /  Fax: [phone]   Initial Consultation    [name] was seen in clinic today to evaluate for obesity. She is interested in losing weight to improve overall health and reduce the risk of weight related complications. She presents today to review program treatment options, initial physical assessment, and evaluation.    Anthropometrics and Bioimpedance Analysis   Body mass index is 36.95 kg/m. Body Fat Mass : 47.8% Visceral Fat Mass Rating : 16 Waist to Height Ratio: TBD  Obesity Related Diseases and Complications  Obesity Quality of Life and Psychosocial Complications: Reduced health-related quality of life  Cardiometabolic: Prediabetes and/or insulin resistance and Hypertension  Biomechanical: GERD   Weight Related History  She was referred by: PCP  When asked what they would like to accomplish? She states: Adopt a healthier eating pattern and lifestyle, Improve energy levels and physical activity, Improve existing medical conditions, and Improve quality of life  Weight history: See below  Highest weight: 220  Contributing factors: family history of obesity and menopause  Prior weight loss attempts: Weight Watchers and Balanced Plate / Portion Control  Current or previous pharmacotherapy: Other: phen-phen awhile  Response to medication: Ineffective so it was discontinued  Current nutrition plan: None  Greatest challenge with dieting: none.  Current level of physical activity: She does endurance and strengthening exercises  Barriers to Exercise: no barriers  Discussed the use of AI scribe software for clinical note transcription with the patient, who gave verbal consent to proceed.  History of Present Illness [name] is a 74 year old female with prediabetes and hyperlipidemia who presents for initial consultation for medical weight management.  She is concerned about her elevated  cholesterol and glucose levels, with a fasting blood sugar of 103 mg/dL and an J8r of 3.7%, indicating prediabetes. She is not on any medication for diabetes but is worried about insulin resistance. Her family history includes obesity and diabetes, with her brother recently diagnosed with diabetes.  She has hyperlipidemia with a total cholesterol of 209 mg/dL and an LDL of 878 mg/dL. She prefers lifestyle changes over statins for managing her cholesterol. Her HDL is high, contributing to the elevated total cholesterol.  She is on hydrochlorothiazide  for hypertension, which may contribute to elevated BUN levels due to inadequate hydration. She experiences chronic constipation, which she attributes to lifelong habits. She has a decreased GFR.  She has gastroesophageal reflux disease (GERD) and is on omeprazole . She takes folic acid  and vitamin D  supplements as her gastroenterologist monitors her for potential deficiencies due to long-term use of omeprazole .  Her physical activity level is high; she walks two to three times a week and takes a strengthening class on Fridays. She notes a decrease in activity during the holidays and inclement weather. She has participated in [employer] and other weight loss programs but finds it difficult to lose weight now despite eating less.  She experiences poor sleep, which she attributes to menopause, and acknowledges that it affects her weight. She has a low stress level and is mindful of her diet, although she enjoys sweet tea and carbohydrates.     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    General:  Alert, oriented and cooperative. Patient is in no acute distress.  Respiratory: Normal respiratory effort, no problems with respiration noted   Gait: able to ambulate independently  Mental Status: Normal mood and affect. Normal behavior. Normal judgment and thought content.   Diagnostic Data Reviewed  BMET    Component Value Date/Time   NA 141 06/06/2024 1050   NA 138 02/13/2021 0000   K 3.9 06/06/2024 1050   CL 102 06/06/2024 1050   CO2 32 06/06/2024 1050   GLUCOSE 98 06/06/2024 1050   BUN 26 (H) 06/06/2024 1050   BUN 19 02/13/2021 0000   CREATININE 0.93 06/06/2024 1050   CREATININE 0.79 03/01/2020 1018   CALCIUM 9.2 06/06/2024 1050   GFRNONAA 62 02/13/2021 0000   GFRNONAA 77 03/01/2020 1018   GFRAA 89 03/01/2020 1018   Lab Results  Component Value Date   HGBA1C 6.2 04/13/2024   HGBA1C 6.3 11/24/2013   No results found for: INSULIN CBC    Component Value Date/Time   WBC 7.2 04/13/2024 1121   RBC 4.14 04/13/2024 1121   HGB 12.4 04/13/2024 1121   HCT 36.1 04/13/2024 1121   PLT 321.0 04/13/2024 1121   MCV 87.1 04/13/2024 1121   MCH 29.5 03/01/2020 1018   MCHC 34.5 04/13/2024 1121   RDW 12.6 04/13/2024 1121   Iron/TIBC/Ferritin/ %Sat    Component Value Date/Time   IRON 103 03/02/2023 0956   TIBC 331.8 03/02/2023 0956   FERRITIN 92.5 03/02/2023 0956   IRONPCTSAT 31.0 03/02/2023 0956   Lipid Panel     Component Value Date/Time   CHOL 209 (H) 04/13/2024 1121   TRIG 107.0 04/13/2024 1121   HDL 66.70 04/13/2024 1121   CHOLHDL 3 04/13/2024 1121   VLDL 21.4 04/13/2024 1121   LDLCALC 121 (H) 04/13/2024 1121   LDLCALC 116 (H) 03/01/2020 1018   Hepatic Function Panel     Component Value Date/Time   PROT 7.5 04/13/2024 1121   ALBUMIN 4.2 04/13/2024 1121   AST 17 04/13/2024 1121   ALT 15 04/13/2024 1121   ALKPHOS 83 04/13/2024  1121   BILITOT 0.5 04/13/2024 1121   BILIDIR 0.1 04/13/2023 1038   IBILI 0.5 03/01/2020 1018      Component Value Date/Time   TSH 1.47 04/13/2024 1121    Medications  Outpatient Encounter Medications as of 07/12/2024  Medication Sig   benazepril -hydrochlorthiazide (LOTENSIN  HCT) 20-12.5 MG tablet TAKE 1 TABLET BY MOUTH EVERY DAY   cyanocobalamin  (VITAMIN B12) 1000 MCG tablet Take 1 tablet (1,000 mcg total) by mouth daily.   ibuprofen (ADVIL) 200 MG tablet Take 200 mg by mouth as needed.   omeprazole  (PRILOSEC) 40 MG capsule Take 1 capsule (40 mg total) by mouth daily.   polyethylene glycol (MIRALAX / GLYCOLAX) 17 g packet Take 17 g by mouth daily. Every other day   Vitamin D , Ergocalciferol , (DRISDOL ) 1.25 MG (50000 UNIT) CAPS capsule Take 1 capsule (50,000 Units total) by mouth every 7 (seven) days. For 8 weeks. After completion do vitamin D  2000 units daily   No facility-administered encounter medications  on file as of 07/12/2024.     Assessment and Plan   Essential hypertension  Pure hypercholesterolemia  Class 2 severe obesity with serious comorbidity and body mass index (BMI) of 37.0 to 37.9 in adult, unspecified obesity type  Decreased GFR  Prediabetes      Assessment & Plan Essential hypertension Vitals:   07/12/24 1400  BP: 134/84    Blood pressure is not at goal for age and risk category.  On benazepril  hydrochlorothiazide  without adverse effects.  Most recent renal parameters reviewed she has a decreased GFR but above threshold for chronic kidney disease.  Continue with weight loss therapy. Losing 10% may improve blood pressure control. Monitor for symptoms of orthostasis while losing weight. Continue current regimen and home monitoring for a goal blood pressure of < 120/80.   Pure hypercholesterolemia LDL is not at goal. Elevated LDL may be secondary to nutrition, genetics and spillover effect from excess adiposity. Recommended LDL goal is <70 to reduce the  risk of fatty streaks and the progression to obstructive ASCVD in the future.   Her 10 year risk is: The 10-year ASCVD risk score (Arnett DK, et al., 2019) is: 29.8%  Lab Results  Component Value Date   CHOL 209 (H) 04/13/2024   HDL 66.70 04/13/2024   LDLCALC 121 (H) 04/13/2024   TRIG 107.0 04/13/2024   CHOLHDL 3 04/13/2024   She has an elevated cardiovascular risk and may benefit from statin therapy.  She had a coronary artery calcium done in 2023 that showed a CAC score of 0.60 less than 1 and has opted to not start statin therapy.  Patient would benefit from a diet low in refined sugars and saturated fats.  She will continue with current levels of physical activity and will receive additional counseling at subsequent appointments.    Class 2 severe obesity with serious comorbidity and body mass index (BMI) of 37.0 to 37.9 in adult, unspecified obesity type We reviewed anthropometrics, biometrics, associated medical conditions and contributing factors with patient. she would benefit from a medically tailored reduced calorie nutrional plan based on her REE (resting energy expenditure), which will be determined by indirect calorimetry.  We will also assess for cardiometabolic risk and nutritional derangements via fasting labs at intake appointment.   Chronic condition with gradual weight gain since age 86. High physical activity levels but struggles with weight loss despite dietary efforts. Interested in GLP-1 agonists for weight management. Discussed GLP-1 agonists' role in weight loss and maintenance, emphasizing lifestyle changes. Explained potential weight regain post-medication and importance of gradual weight loss to prevent muscle loss and falls. Highlighted need for comprehensive approach including nutrition, physical activity, and behavioral modification. - Scheduled intake appointment for health questionnaire and blood work. - Will perform fasting blood work including insulin  levels and high sensitivity CRP. - Will conduct indirect spirometry to assess metabolic rate. - Will discuss nutrition therapy focusing on calorie deficit and low carb, high protein diet. - Will incorporate physical activity and behavioral modification strategies. - Will consider GLP-1 agonists for weight management. Decreased GFR  Latest Reference Range & Units 08/22/13 08:07 11/24/13 08:15 09/28/14 08:01 12/31/15 09:29 01/26/17 09:08 02/28/18 11:18 02/28/19 09:45 03/04/21 09:28 04/06/22 13:59 03/02/23 09:56 04/13/23 10:38 04/13/24 11:21 06/06/24 10:50  GFR >60.00 mL/min 72.08 80.72 78.15 75.63 71.29 75.12 60.08 66.20 63.96 56.76 (L) 62.68 65.65 61.37  (L): Data is abnormally low  I reviewed her trends this been a downward trend in GFR over the last 10 years.  This could be an effect of ACE inhibitor on the kidneys.  She is currently on benazepril  and hydrochlorothiazide  she acknowledges not maintaining adequate hydration.  She is also on a chronic PPI patient may be at risk for chronic kidney disease.  She was advised to work with her gastroenterologist to find the lowest effective dose of PPI.  She will work on increasing fluid intake and avoiding nephrotoxins. Prediabetes Most recent A1c is  Lab Results  Component Value Date   HGBA1C 6.2 04/13/2024   HGBA1C 6.3 11/24/2013    Patient aware of disease state and risk of progression. This may contribute to abnormal cravings, fatigue and diabetic complications without having diabetes.   We have discussed treatment options which include: losing 7 to 10% of body weight, increasing physical activity to a goal of 150 minutes a week at moderate intensity.  Advised to maintain a diet low on simple and processed carbohydrates.  She may also be a candidate for pharmacoprophylaxis with metformin or incretin mimetic.    Assessment and Plan Assessment & Plan     Recording duration: 37 minutes    Obesity Treatment and Action Plan:  Patient  will work on garnering support from family and friends to begin weight loss journey. Will work on eliminating or reducing the presence of highly palatable, calorie dense foods in the home. Will complete provided nutritional and psychosocial assessment questionnaire before the next appointment. Will be scheduled for indirect calorimetry to determine resting energy expenditure in a fasting state.  This will allow us  to create a reduced calorie, high-protein meal plan to promote loss of fat mass while preserving muscle mass. Counseled on the health benefits of losing 5%-15% of total body weight. Was counseled on nutritional approaches to weight loss and benefits of reducing processed foods and consuming plant-based foods and high quality protein as part of nutritional weight management. Was counseled on pharmacotherapy and role as an adjunct in weight management.   Education and Additional resources  She was weighed on the bioimpedance scale and results were discussed and documented in the synopsis.  We discussed obesity as a progressive, chronic disease and the importance of a more detailed evaluation of all the factors contributing to the disease.  We reviewed the basic principles in obesity management.   We discussed the importance of long term lifestyle changes which include nutrition, exercise and behavioral modification as well as the importance of customizing this to her specific health and social needs.  We reviewed the role of medical interventions including pharmacotherapy and surgical interventions.   We discussed the benefits of reaching a healthier weight to alleviate the symptoms of existing conditions and reduce the risks of the biomechanical, cardiometabolic and psychological effects of obesity.  We reviewed our program approach and philosophy, which are guided by the four pillars of obesity medicine.  We discussed how to prepare for intake appointment and the importance of  fasting and avoidance of stimulants for at least 8 hours prior to indirect calorimetry.  [name] appears to be in the action stage of change and reports being ready to initiate intensive lifestyle and behavioral modifications as part of their weight loss journey.  Attestation  Reviewed by clinician on day of visit: allergies, medications, problem list, medical history, surgical history, family history, social history, and previous encounter notes pertinent to obesity diagnosis.  I have spent 47 minutes in the care of the patient today including: 3 minutes before the visit reviewing and preparing the chart. 37  minutes face-to-face assessing and reviewing listed medical problems as outlined in obesity care plan, providing nutritional and behavioral counseling on topics outlined in the obesity care plan, counseling regarding anti-obesity medication as outlined in obesity care plan, independently interpreting test results and goals of care, as described in assessment and plan, reviewing and discussing biometric information and progress, and reviewing latest PCP notes and specialist consultations 7 minutes after the visit updating chart and documentation of encounter.   Lucas Parker, MD, ABIM, ABOM   "

## 2024-07-13 DIAGNOSIS — R7303 Prediabetes: Secondary | ICD-10-CM | POA: Insufficient documentation

## 2024-07-13 DIAGNOSIS — R944 Abnormal results of kidney function studies: Secondary | ICD-10-CM | POA: Insufficient documentation

## 2024-07-13 DIAGNOSIS — E7800 Pure hypercholesterolemia, unspecified: Secondary | ICD-10-CM | POA: Insufficient documentation

## 2024-07-13 NOTE — Assessment & Plan Note (Signed)
 LDL is not at goal. Elevated LDL may be secondary to nutrition, genetics and spillover effect from excess adiposity. Recommended LDL goal is <70 to reduce the risk of fatty streaks and the progression to obstructive ASCVD in the future.   Her 10 year risk is: The 10-year ASCVD risk score (Arnett DK, et al., 2019) is: 29.8%  Lab Results  Component Value Date   CHOL 209 (H) 04/13/2024   HDL 66.70 04/13/2024   LDLCALC 121 (H) 04/13/2024   TRIG 107.0 04/13/2024   CHOLHDL 3 04/13/2024   She has an elevated cardiovascular risk and may benefit from statin therapy.  She had a coronary artery calcium done in 2023 that showed a CAC score of 0.60 less than 1 and has opted to not start statin therapy.  Patient would benefit from a diet low in refined sugars and saturated fats.  She will continue with current levels of physical activity and will receive additional counseling at subsequent appointments.

## 2024-07-13 NOTE — Assessment & Plan Note (Signed)
 Vitals:   07/12/24 1400  BP: 134/84    Blood pressure is not at goal for age and risk category.  On benazepril  hydrochlorothiazide  without adverse effects.  Most recent renal parameters reviewed she has a decreased GFR but above threshold for chronic kidney disease.  Continue with weight loss therapy. Losing 10% may improve blood pressure control. Monitor for symptoms of orthostasis while losing weight. Continue current regimen and home monitoring for a goal blood pressure of < 120/80.

## 2024-07-13 NOTE — Assessment & Plan Note (Signed)
"   Latest Reference Range & Units 08/22/13 08:07 11/24/13 08:15 09/28/14 08:01 12/31/15 09:29 01/26/17 09:08 02/28/18 11:18 02/28/19 09:45 03/04/21 09:28 04/06/22 13:59 03/02/23 09:56 04/13/23 10:38 04/13/24 11:21 06/06/24 10:50  GFR >60.00 mL/min 72.08 80.72 78.15 75.63 71.29 75.12 60.08 66.20 63.96 56.76 (L) 62.68 65.65 61.37  (L): Data is abnormally low  I reviewed her trends this been a downward trend in GFR over the last 10 years.  This could be an effect of ACE inhibitor on the kidneys.  She is currently on benazepril  and hydrochlorothiazide  she acknowledges not maintaining adequate hydration.  She is also on a chronic PPI patient may be at risk for chronic kidney disease.  She was advised to work with her gastroenterologist to find the lowest effective dose of PPI.  She will work on increasing fluid intake and avoiding nephrotoxins. "

## 2024-07-13 NOTE — Assessment & Plan Note (Signed)
 Most recent A1c is  Lab Results  Component Value Date   HGBA1C 6.2 04/13/2024   HGBA1C 6.3 11/24/2013    Patient aware of disease state and risk of progression. This may contribute to abnormal cravings, fatigue and diabetic complications without having diabetes.   We have discussed treatment options which include: losing 7 to 10% of body weight, increasing physical activity to a goal of 150 minutes a week at moderate intensity.  Advised to maintain a diet low on simple and processed carbohydrates.  She may also be a candidate for pharmacoprophylaxis with metformin or incretin mimetic.

## 2024-07-13 NOTE — Assessment & Plan Note (Signed)
 We reviewed anthropometrics, biometrics, associated medical conditions and contributing factors with patient. she would benefit from a medically tailored reduced calorie nutrional plan based on her REE (resting energy expenditure), which will be determined by indirect calorimetry.  We will also assess for cardiometabolic risk and nutritional derangements via fasting labs at intake appointment.   Chronic condition with gradual weight gain since age 74. High physical activity levels but struggles with weight loss despite dietary efforts. Interested in GLP-1 agonists for weight management. Discussed GLP-1 agonists' role in weight loss and maintenance, emphasizing lifestyle changes. Explained potential weight regain post-medication and importance of gradual weight loss to prevent muscle loss and falls. Highlighted need for comprehensive approach including nutrition, physical activity, and behavioral modification. - Scheduled intake appointment for health questionnaire and blood work. - Will perform fasting blood work including insulin levels and high sensitivity CRP. - Will conduct indirect spirometry to assess metabolic rate. - Will discuss nutrition therapy focusing on calorie deficit and low carb, high protein diet. - Will incorporate physical activity and behavioral modification strategies. - Will consider GLP-1 agonists for weight management.

## 2024-07-31 ENCOUNTER — Ambulatory Visit (INDEPENDENT_AMBULATORY_CARE_PROVIDER_SITE_OTHER): Admitting: Internal Medicine

## 2024-08-14 ENCOUNTER — Ambulatory Visit (INDEPENDENT_AMBULATORY_CARE_PROVIDER_SITE_OTHER): Admitting: Internal Medicine

## 2025-04-17 ENCOUNTER — Encounter: Admitting: Internal Medicine
